# Patient Record
Sex: Male | Born: 1975
Health system: Southern US, Community
[De-identification: ages and names within clinical notes are randomized; demographics above are authoritative.]

## PROBLEM LIST (undated history)

## (undated) DIAGNOSIS — M1990 Unspecified osteoarthritis, unspecified site: Secondary | ICD-10-CM

## (undated) DIAGNOSIS — K219 Gastro-esophageal reflux disease without esophagitis: Secondary | ICD-10-CM

## (undated) DIAGNOSIS — T7840XA Allergy, unspecified, initial encounter: Secondary | ICD-10-CM

## (undated) HISTORY — DX: Unspecified osteoarthritis, unspecified site: M19.90

## (undated) HISTORY — PX: DRUG INDUCED ENDOSCOPY: SHX6808

## (undated) HISTORY — DX: Gastro-esophageal reflux disease without esophagitis: K21.9

## (undated) HISTORY — DX: Allergy, unspecified, initial encounter: T78.40XA

---

## 2008-08-27 ENCOUNTER — Emergency Department (HOSPITAL_COMMUNITY): Admission: EM | Admit: 2008-08-27 | Discharge: 2008-08-27 | Payer: Self-pay | Admitting: Emergency Medicine

## 2011-02-21 ENCOUNTER — Encounter: Payer: Self-pay | Admitting: *Deleted

## 2011-02-21 ENCOUNTER — Emergency Department
Admission: EM | Admit: 2011-02-21 | Discharge: 2011-02-21 | Disposition: A | Discharge status: Home / Self Care | Payer: 59 | Source: Home / Self Care | Attending: Emergency Medicine | Admitting: Emergency Medicine

## 2011-02-21 DIAGNOSIS — J209 Acute bronchitis, unspecified: Secondary | ICD-10-CM

## 2011-02-21 DIAGNOSIS — R059 Cough, unspecified: Secondary | ICD-10-CM

## 2011-02-21 DIAGNOSIS — R05 Cough: Secondary | ICD-10-CM

## 2011-02-21 DIAGNOSIS — J069 Acute upper respiratory infection, unspecified: Secondary | ICD-10-CM

## 2011-02-21 LAB — POCT RAPID STREP A (OFFICE): Rapid Strep A Screen: NEGATIVE

## 2011-02-21 MED ORDER — PROMETHAZINE-CODEINE 6.25-10 MG/5ML PO SYRP: 5.0000 mL | ORAL_SOLUTION | ORAL | Status: AC | PRN

## 2011-02-21 MED ORDER — AZITHROMYCIN 250 MG PO TABS: ORAL_TABLET | ORAL | Status: AC

## 2011-02-21 NOTE — ED Notes (Signed)
Pt c/o fever, dry cough, SOB, and mouth sores x 5 days. He has taken IBF and cough gtts.

## 2011-02-21 NOTE — ED Provider Notes (Signed)
History     CSN: [id number] Arrival date & time: 02/21/2011  5:59 PM   First MD Initiated Contact with Patient 02/21/11 1744      Chief Complaint  Patient presents with  . Fever  . Cough    (Consider location/radiation/quality/duration/timing/severity/associated sxs/prior treatment) HPI Noah Lopez is a 35 y.o. male who complains of onset of cold symptoms for 5 days. His wife is also present with him during his exam. His wife is a Publishing rights manager.  + sore throat + cough No pleuritic pain No wheezing No nasal congestion + post-nasal drainage No sinus pain/pressure + chest congestion No itchy/red eyes No earache No hemoptysis + SOB No chills/sweats No fever No nausea No vomiting No abdominal pain No diarrhea No skin rashes +o fatigue + myalgias + headache    History reviewed. No pertinent past medical history.  History reviewed. No pertinent past surgical history.  Family History  Problem Relation Age of Onset  . Diverticulitis Mother   . GER disease Father     History  Substance Use Topics  . Smoking status: Never Smoker   . Smokeless tobacco: Not on file  . Alcohol Use: No      Review of Systems  Allergies  Penicillins  Home Medications   Current Outpatient Rx  Name Route Sig Dispense Refill  . AZITHROMYCIN 250 MG PO TABS  Use as directed 1 each 0  . PROMETHAZINE-CODEINE 6.25-10 MG/5ML PO SYRP Oral Take 5 mLs by mouth every 4 (four) hours as needed for cough. 120 mL 0    BP 108/66  Pulse 58  Temp(Src) 99.5 F (37.5 C) (Oral)  Resp 18  Ht 6' (1.829 m)  Wt 186 lb 12.8 oz (84.732 kg)  BMI 25.33 kg/m2  SpO2 99%  Physical Exam  Nursing note and vitals reviewed. Constitutional: He is oriented to person, place, and time. He appears well-developed and well-nourished. He appears distressed (Coughing during exam).  HENT:  Head: Normocephalic and atraumatic.  Right Ear: Tympanic membrane, external ear and ear canal normal.  Left Ear:  Tympanic membrane, external ear and ear canal normal.  Nose: Mucosal edema and rhinorrhea present.  Mouth/Throat: Posterior oropharyngeal erythema present. No oropharyngeal exudate or posterior oropharyngeal edema.  Neck: Neck supple.  Cardiovascular: Regular rhythm and normal heart sounds.   Pulmonary/Chest: Effort normal and breath sounds normal. No respiratory distress. He has no wheezes. He has no rhonchi.  Neurological: He is alert and oriented to person, place, and time.  Skin: Skin is warm and dry.  Psychiatric: He has a normal mood and affect. His speech is normal.    ED Course  Procedures (including critical care time)  Labs Reviewed - No data to display No results found.   1. Acute bronchitis   2. Cough   3. Acute upper respiratory infections of unspecified site       MDM  1)  Take the prescribed antibiotic as instructed.  I discussed getting a chest x-ray with them and they would like to hold off for now. I also gave him a prescription for cough medicine. He states that he has gotten sick with a hydrocodone tablet previously, but is willing to try the codeine cough medicine. 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 He is off of work for the next 2-3 days.  If he continues to be sick, we can consider doing a chest x-ray versus starting him on  prednisone. 2)  Use nasal saline solution (over the counter) at least 3 times a day. 3)  Use over the counter decongestants like Zyrtec-D every 12 hours as needed to help with congestion.  If you have hypertension, do not take medicines with sudafed.  4)  Can take tylenol every 6 hours or motrin every 8 hours for pain or fever. 5)  Follow up with your primary doctor if no improvement in 5-7 days, sooner if increasing pain, fever, or new symptoms.     Lily Kocher, MD 02/21/11 Rickey Primus

## 2011-06-01 ENCOUNTER — Emergency Department (HOSPITAL_COMMUNITY)
Admission: EM | Admit: 2011-06-01 | Discharge: 2011-06-01 | Disposition: A | Discharge status: Home / Self Care | Payer: Worker's Compensation | Attending: Emergency Medicine | Admitting: Emergency Medicine

## 2011-06-01 ENCOUNTER — Encounter (HOSPITAL_COMMUNITY): Payer: Self-pay

## 2011-06-01 DIAGNOSIS — S01512A Laceration without foreign body of oral cavity, initial encounter: Secondary | ICD-10-CM

## 2011-06-01 DIAGNOSIS — S01501A Unspecified open wound of lip, initial encounter: Secondary | ICD-10-CM | POA: Insufficient documentation

## 2011-06-01 DIAGNOSIS — S025XXA Fracture of tooth (traumatic), initial encounter for closed fracture: Secondary | ICD-10-CM

## 2011-06-01 DIAGNOSIS — Y9269 Other specified industrial and construction area as the place of occurrence of the external cause: Secondary | ICD-10-CM | POA: Insufficient documentation

## 2011-06-01 DIAGNOSIS — Z23 Encounter for immunization: Secondary | ICD-10-CM | POA: Insufficient documentation

## 2011-06-01 DIAGNOSIS — Z880 Allergy status to penicillin: Secondary | ICD-10-CM | POA: Insufficient documentation

## 2011-06-01 MED ORDER — IBUPROFEN 600 MG PO TABS: 600.0000 mg | ORAL_TABLET | Freq: Four times a day (QID) | ORAL | Status: AC | PRN

## 2011-06-01 MED ORDER — TETANUS-DIPHTH-ACELL PERTUSSIS 5-2.5-18.5 LF-MCG/0.5 IM SUSP
0.5000 mL | Freq: Once | INTRAMUSCULAR | Status: AC
  Administered 2011-06-01: 0.5 mL via INTRAMUSCULAR
  Filled 2011-06-01: qty 0.5

## 2011-06-01 MED ORDER — IBUPROFEN 800 MG PO TABS
800.0000 mg | ORAL_TABLET | Freq: Once | ORAL | Status: AC
  Administered 2011-06-01: 800 mg via ORAL
  Filled 2011-06-01: qty 1

## 2011-06-01 MED ORDER — CHLORHEXIDINE GLUCONATE 0.12 % MT SOLN: 15.0000 mL | Freq: Two times a day (BID) | OROMUCOSAL | Status: AC
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
## 2011-06-01 NOTE — Discharge Instructions (Signed)
Mouth Laceration  A mouth laceration is a cut inside the mouth.  TREATMENT   Because of all the bacteria in the mouth, lacerations are usually not stitched (sutured) unless the wound is gaping open. Sometimes, a couple sutures may be placed just to hold the edges of the wound together and to speed healing. Over the next 1 to 2 days, you will see that the wound edges appear gray in color. The edges may appear ragged and slightly spread apart. Because of all the normal bacteria in the mouth, these wounds are contaminated, but this is not an infection that needs antibiotics. Most wounds heal with no problems despite their appearance.  HOME CARE INSTRUCTIONS    Rinse your mouth with a warm, saltwater wash 4 to 6 times per day, or as your caregiver instructs.   Continue oral hygiene and gentle tooth brushing as normal, if possible.   Do not eat or drink hot food or beverages while your mouth is still numb.   Eat a bland diet to avoid irritation from acidic foods.   Only take over-the-counter or prescription medicines for pain, discomfort, or fever as directed by your caregiver.   Follow up with your caregiver as instructed. You may need to see your caregiver for a wound check in 48 to 72 hours to make sure your wound is healing.   If your laceration was sutured, do not play with the sutures or knots with your tongue. If you do this, they will gradually loosen and may become untied.  You may need a tetanus shot if:   You cannot remember when you had your last tetanus shot.   You have never had a tetanus shot.  If you get a tetanus shot, your arm may swell, get red, and feel warm to the touch. This is common and not a problem. If you need a tetanus shot and you choose not to have one, there is a rare chance of getting tetanus. Sickness from tetanus can be serious.  SEEK MEDICAL CARE IF:    You develop swelling or increasing pain in the wound or in other parts of your face.   You have a fever.   You develop  swollen, tender glands in the throat.   You notice the wound edges do not stay together after your sutures have been removed.   You see pus coming from the wound. Some drainage in the mouth is normal.  MAKE SURE YOU:    Understand these instructions.   Will watch your condition.   Will get help right away if you are not doing well or get worse.  Document Released: 03/13/2005 Document Revised: 03/02/2011 Document Reviewed: 09/15/2010  [employer] Patient Information 2012 [employer].

## 2011-06-03 NOTE — ED Provider Notes (Signed)
36 y.o.malewho presents with minor mouth injury  HEENT: Two small lacerations over internal surface of the left upper lip with swelling noted and no active bleeding CV: RRR, no m/r/g, no pitting edema of the lower extremities Pulm:CTAB, no c/w/r GI: SNTND, + BS, no guarding or rebound Skin: no rashes noted MSK: moves all 4 extremities symmetrically, no deformities or injuries noted Neuro: CN 2-12 intact, no abnormalities of strength or sensation, A and O x 3  I saw and evaluated the patient, reviewed the resident's note and I agree with the findings and plan.        Cyndra Numbers, MD 06/03/11 1710

## 2012-08-09 ENCOUNTER — Ambulatory Visit: Payer: Self-pay

## 2012-08-09 ENCOUNTER — Other Ambulatory Visit: Payer: Self-pay | Admitting: Occupational Medicine

## 2012-08-09 DIAGNOSIS — R52 Pain, unspecified: Secondary | ICD-10-CM

## 2012-12-25 ENCOUNTER — Encounter (HOSPITAL_COMMUNITY): Payer: Self-pay | Admitting: Emergency Medicine

## 2012-12-25 ENCOUNTER — Emergency Department (HOSPITAL_COMMUNITY)
Admission: EM | Admit: 2012-12-25 | Discharge: 2012-12-25 | Disposition: A | Discharge status: Home / Self Care | Payer: 59 | Attending: Emergency Medicine | Admitting: Emergency Medicine

## 2012-12-25 ENCOUNTER — Emergency Department (HOSPITAL_COMMUNITY): Payer: 59

## 2012-12-25 DIAGNOSIS — Y9389 Activity, other specified: Secondary | ICD-10-CM | POA: Insufficient documentation

## 2012-12-25 DIAGNOSIS — R609 Edema, unspecified: Secondary | ICD-10-CM | POA: Insufficient documentation

## 2012-12-25 DIAGNOSIS — W219XXA Striking against or struck by unspecified sports equipment, initial encounter: Secondary | ICD-10-CM | POA: Insufficient documentation

## 2012-12-25 DIAGNOSIS — S92919A Unspecified fracture of unspecified toe(s), initial encounter for closed fracture: Secondary | ICD-10-CM | POA: Insufficient documentation

## 2012-12-25 DIAGNOSIS — Z791 Long term (current) use of non-steroidal anti-inflammatories (NSAID): Secondary | ICD-10-CM | POA: Insufficient documentation

## 2012-12-25 DIAGNOSIS — Z880 Allergy status to penicillin: Secondary | ICD-10-CM | POA: Insufficient documentation

## 2012-12-25 DIAGNOSIS — Y929 Unspecified place or not applicable: Secondary | ICD-10-CM | POA: Insufficient documentation

## 2012-12-25 DIAGNOSIS — S92401A Displaced unspecified fracture of right great toe, initial encounter for closed fracture: Secondary | ICD-10-CM

## 2012-12-25 MED ORDER — ONDANSETRON 8 MG PO TBDP: 8.0000 mg | ORAL_TABLET | Freq: Once | ORAL | Status: DC

## 2012-12-25 MED ORDER — HYDROCODONE-ACETAMINOPHEN 5-325 MG PO TABS: 1.0000 | ORAL_TABLET | ORAL | Status: DC | PRN

## 2012-12-25 MED ORDER — HYDROCODONE-ACETAMINOPHEN 5-325 MG PO TABS
1.0000 | ORAL_TABLET | Freq: Once | ORAL | Status: AC
  Administered 2012-12-25: 1 via ORAL
  Filled 2012-12-25: qty 1

## 2012-12-25 MED ORDER — ONDANSETRON 4 MG PO TBDP
8.0000 mg | ORAL_TABLET | Freq: Once | ORAL | Status: AC
Start: 2012-12-25 — End: 2012-12-25
  Administered 2012-12-25: 8 mg via ORAL
  Filled 2012-12-25: qty 2

## 2012-12-25 NOTE — ED Provider Notes (Signed)
CSN: [id number]     Arrival date & time 12/25/12  2122 History   First MD Initiated Contact with Patient 12/25/12 2201     Chief Complaint  Patient presents with  . Foot Injury   (Consider location/radiation/quality/duration/timing/severity/associated sxs/prior Treatment) HPI HPI Comments: Noah Lopez is a 37 y.o. male who presents to the Emergency Department complaining of constant sharp right great toe pain with radiation to the second toe that began after being hit with lacrosse ball around 8 PM this evening. Patient was wearing shoes at the time. Since incident patient has noticed swelling to the right great toe. He rates his pain 8/10 worsened with ambulating and palpation. He states resting minimally alleviates his pain. Denies fevers, LOC, HA.    History reviewed. No pertinent past medical history. History reviewed. No pertinent past surgical history. Family History  Problem Relation Age of Onset  . Diverticulitis Mother   . GER disease Father    History  Substance Use Topics  . Smoking status: Never Smoker   . Smokeless tobacco: Not on file  . Alcohol Use: No    Review of Systems  Constitutional: Negative for fever.  Respiratory: Negative for shortness of breath.   Cardiovascular: Negative for chest pain.  Musculoskeletal: Positive for myalgias, joint swelling and arthralgias.  Skin: Negative for wound.    Allergies  Penicillins  Home Medications   Current Outpatient Rx  Name  Route  Sig  Dispense  Refill  . ibuprofen (ADVIL,MOTRIN) 200 MG tablet   Oral   Take 600 mg by mouth every 6 (six) hours as needed for pain.         Marland Kitchen loratadine (CLARITIN) 10 MG tablet   Oral   Take 10 mg by mouth daily as needed for allergies.         . naproxen (NAPROSYN) 250 MG tablet   Oral   Take 250 mg by mouth 2 (two) times daily with a meal.         . HYDROcodone-acetaminophen (NORCO/VICODIN) 5-325 MG per tablet   Oral   Take 1-2 tablets by mouth every 4 (four)  hours as needed for pain.   30 tablet   0   . ondansetron (ZOFRAN-ODT) 8 MG disintegrating tablet   Oral   Take 1 tablet (8 mg total) by mouth once.   20 tablet   0    Triage Vitals: BP 110/79  Pulse 73  Temp(Src) 98.3 F (36.8 C) (Oral)  Resp 14  SpO2 98% Physical Exam  Constitutional: He is oriented to person, place, and time. He appears well-developed and well-nourished. No distress.  HENT:  Head: Normocephalic and atraumatic.  Right Ear: External ear normal.  Left Ear: External ear normal.  Nose: Nose normal.  Eyes: Conjunctivae are normal.  Neck: Neck supple.  Cardiovascular: Intact distal pulses.   Pulmonary/Chest: Effort normal.  Musculoskeletal:       Right ankle: Normal. Achilles tendon normal.       Left ankle: Normal.       Right foot: He exhibits tenderness, bony tenderness and swelling. He exhibits no crepitus, no deformity and no laceration.       Left foot: Normal.       Feet:  Decreased ROM of R great toe. Decreased strength of R great toe.   Neurological: He is alert and oriented to person, place, and time. No sensory deficit.  Skin: Skin is warm and dry. He is not diaphoretic.  Psychiatric: He has a normal  mood and affect.    ED Course  Procedures (including critical care time)   COORDINATION OF CARE:   Medications  HYDROcodone-acetaminophen (NORCO/VICODIN) 5-325 MG per tablet 1-2 tablet (not administered)  ondansetron (ZOFRAN-ODT) disintegrating tablet 8 mg (not administered)     Labs Review Labs Reviewed - No data to display Imaging Review Dg Foot Complete Right  12/25/2012   CLINICAL DATA:  Hit in great toe with lacrosse ball.  EXAM: RIGHT FOOT COMPLETE - 3+ VIEW  COMPARISON:  None.  FINDINGS: There is a comminuted fracture through the right great toe distal phalanx. Intra-articular extension. Minimal displacement of fracture fragments.  IMPRESSION: Comminuted intra-articular right great toe distal phalanx heel fracture.   Electronically  Signed   By: Charlett Nose M.D.   On: 12/25/2012 22:47    MDM   1. Fracture of right great toe    Afebrile, NAD, non-toxic appearing, AAOx4. Neurovascularly intact. Sensation intact. No open fracture. X-ray reviewed comminuted intra-articular right great toe distal phalanx fracture. Provided crutches, and walking boot with non-weight bearing advised. Pain medication advised. RICE method discussed. Patient will followup with Dr. Thurston Hole his orthopedist to schedule a followup appointment. Return precautions discussed. Patient is agreeable to plan. Patient is stable at time of discharge. Patient d/w with Dr. Manus Gunning, agrees with plan.      I personally performed the services described in this documentation, which was scribed in my presence. The recorded information has been reviewed and is accurate.     Lise Auer Sadiya Durand, PA-C 12/26/12 0015

## 2012-12-25 NOTE — ED Notes (Signed)
Pt. reports right foot pain /  injury while playing lacrosse this evening , pt. stated  right foot hit by lacrosse ball .

## 2012-12-26 NOTE — ED Provider Notes (Signed)
Medical screening examination/treatment/procedure(s) were performed by non-physician practitioner and as supervising physician I was immediately available for consultation/collaboration.   Wrenna Saks, MD 12/26/12 0022 

## 2014-03-12 ENCOUNTER — Emergency Department (HOSPITAL_COMMUNITY)
Admission: EM | Admit: 2014-03-12 | Discharge: 2014-03-12 | Disposition: A | Discharge status: Home / Self Care | Payer: Worker's Compensation | Attending: Emergency Medicine | Admitting: Emergency Medicine

## 2014-03-12 ENCOUNTER — Encounter (HOSPITAL_COMMUNITY): Payer: Self-pay

## 2014-03-12 ENCOUNTER — Emergency Department (HOSPITAL_COMMUNITY): Payer: Worker's Compensation

## 2014-03-12 DIAGNOSIS — Y998 Other external cause status: Secondary | ICD-10-CM | POA: Diagnosis not present

## 2014-03-12 DIAGNOSIS — T148XXA Other injury of unspecified body region, initial encounter: Secondary | ICD-10-CM

## 2014-03-12 DIAGNOSIS — Y9289 Other specified places as the place of occurrence of the external cause: Secondary | ICD-10-CM | POA: Diagnosis not present

## 2014-03-12 DIAGNOSIS — W108XXA Fall (on) (from) other stairs and steps, initial encounter: Secondary | ICD-10-CM | POA: Insufficient documentation

## 2014-03-12 DIAGNOSIS — Y9301 Activity, walking, marching and hiking: Secondary | ICD-10-CM | POA: Diagnosis not present

## 2014-03-12 DIAGNOSIS — S3992XA Unspecified injury of lower back, initial encounter: Secondary | ICD-10-CM | POA: Insufficient documentation

## 2014-03-12 DIAGNOSIS — S50312A Abrasion of left elbow, initial encounter: Secondary | ICD-10-CM | POA: Insufficient documentation

## 2014-03-12 DIAGNOSIS — S59902A Unspecified injury of left elbow, initial encounter: Secondary | ICD-10-CM | POA: Diagnosis present

## 2014-03-12 DIAGNOSIS — W19XXXA Unspecified fall, initial encounter: Secondary | ICD-10-CM

## 2014-03-12 DIAGNOSIS — M25522 Pain in left elbow: Secondary | ICD-10-CM

## 2014-03-12 MED ORDER — NAPROXEN 500 MG PO TABS: 500.0000 mg | ORAL_TABLET | Freq: Two times a day (BID) | ORAL | Status: DC

## 2014-03-12 NOTE — Discharge Instructions (Signed)
Elbow Contusion An elbow contusion is a deep bruise of the elbow. Contusions are the result of an injury that caused bleeding under the skin. The contusion may turn blue, purple, or yellow. Minor injuries will give you a painless contusion, but more severe contusions may stay painful and swollen for a few weeks.  CAUSES  An elbow contusion comes from a direct force to that area, such as falling on the elbow. SYMPTOMS   Swelling and redness of the elbow.  Bruising of the elbow area.  Tenderness or soreness of the elbow. DIAGNOSIS  You will have a physical exam and will be asked about your history. You may need an X-ray of your elbow to look for a broken bone (fracture).  TREATMENT  A sling or splint may be needed to support your injury. Resting, elevating, and applying cold compresses to the elbow area are often the best treatments for an elbow contusion. Over-the-counter medicines may also be recommended for pain control. HOME CARE INSTRUCTIONS   Put ice on the injured area.  Put ice in a plastic bag.  Place a towel between your skin and the bag.  Leave the ice on for 15-20 minutes, 03-04 times a day.  Only take over-the-counter or prescription medicines for pain, discomfort, or fever as directed by your caregiver.  Rest your injured elbow until the pain and swelling are better.  Elevate your elbow to reduce swelling.  Apply a compression wrap as directed by your caregiver. This can help reduce swelling and motion. You may remove the wrap for sleeping, showers, and baths. If your fingers become numb, cold, or blue, take the wrap off and reapply it more loosely.  Use your elbow only as directed by your caregiver. You may be asked to do range of motion exercises. Do them as directed.  See your caregiver as directed. It is very important to keep all follow-up appointments in order to avoid any long-term problems with your elbow, including chronic pain or inability to move your elbow  normally. SEEK IMMEDIATE MEDICAL CARE IF:   You have increased redness, swelling, or pain in your elbow.  Your swelling or pain is not relieved with medicines.  You have swelling of the hand and fingers.  You are unable to move your fingers or wrist.  You begin to lose feeling in your hand or fingers.  Your fingers or hand become cold or blue. MAKE SURE YOU:   Understand these instructions.  Will watch your condition.  Will get help right away if you are not doing well or get worse. Document Released: 02/19/2006 Document Revised: 06/05/2011 Document Reviewed: 01/27/2011 [employer] Patient Information 2015 [employer]. This information is not intended to replace advice given to you by your health care provider. Make sure you discuss any questions you have with your health care provider.  

## 2014-03-12 NOTE — ED Notes (Signed)
Per Patient, Patient was walking down a flight of concrete stairs when he slipped and landed at the bottom on his lower back. Patient complains of left elbow pain and lower back pain. Patient was able to ambulate after the fall. Swelling noted to the left hand. Patient reports 5/10 sharp, throbbing pain to the left elbow. Patient alert and oriented x4. Patient denies any head injury.

## 2014-03-12 NOTE — ED Provider Notes (Signed)
CSN: [id number]     Arrival date & time 03/12/14  1636 History  This chart was scribed for Noah Mail, PA-C working with Orpah Greek, * by Randa Evens, ED Scribe. This patient was seen in room TR07C/TR07C and the patient's care was started at 4:56 PM.    Chief Complaint  Patient presents with  . Fall  . Elbow Injury   The history is provided by the patient. No language interpreter was used.   HPI Comments: Brek Reece is a 38 y.o. male who presents to the Emergency Department complaining of fall onset PTA. Pt states that he was chasing some one and lost focus and slipped down about 10 ft of concrete steps and landed on the bottom step onto his back. He is complaining of throbbing elbow pain and low back pain. Pt states he had associated elbow swelling and bruising. Pt rates his elbow pain 5/10. He states that he has some numbness in his left fingers as well. Pt denies head injuy or LOC. Pt states that his TDAP is UTD   History reviewed. No pertinent past medical history. History reviewed. No pertinent past surgical history. Family History  Problem Relation Age of Onset  . Diverticulitis Mother   . GER disease Father    History  Substance Use Topics  . Smoking status: Never Smoker   . Smokeless tobacco: Not on file  . Alcohol Use: No    Review of Systems  Constitutional: Negative for fever and chills.  Musculoskeletal: Positive for back pain, joint swelling and arthralgias. Negative for gait problem.  Skin: Positive for color change.  Neurological: Positive for numbness. Negative for weakness.    Allergies  Penicillins  Home Medications   Prior to Admission medications   Medication Sig Start Date End Date Taking? Authorizing Provider  HYDROcodone-acetaminophen (NORCO/VICODIN) 5-325 MG per tablet Take 1-2 tablets by mouth every 4 (four) hours as needed for pain. 12/25/12   Jennifer L Piepenbrink, PA-C  ibuprofen (ADVIL,MOTRIN) 200 MG tablet Take 600 mg  by mouth every 6 (six) hours as needed for pain.    Historical Provider, MD  loratadine (CLARITIN) 10 MG tablet Take 10 mg by mouth daily as needed for allergies.    Historical Provider, MD  naproxen (NAPROSYN) 500 MG tablet Take 1 tablet (500 mg total) by mouth 2 (two) times daily with a meal. 03/12/14   Noah Mail, PA-C  ondansetron (ZOFRAN-ODT) 8 MG disintegrating tablet Take 1 tablet (8 mg total) by mouth once. 12/25/12   Jennifer L Piepenbrink, PA-C   Triage Vitals: BP 115/68 mmHg  Pulse 70  Temp(Src) 98.1 F (36.7 C) (Oral)  Resp 16  SpO2 99%   Physical Exam  Constitutional: He is oriented to person, place, and time. He appears well-developed and well-nourished. No distress.  HENT:  Head: Normocephalic and atraumatic.  Eyes: Conjunctivae and EOM are normal.  Neck: Neck supple. No tracheal deviation present.  Cardiovascular: Normal rate.   Pulmonary/Chest: Effort normal. No respiratory distress.  Musculoskeletal: Normal range of motion.  Tenderness to left lateral border of sacrum, no swelling, no step off, no bruising.  Left elbow: abrasion superior to olecranon bony tenderness on lateral epicondyle  of humerus, minimal extension pass 45 degrees but is able to supernate and pronate with no pain, distal pulses intact, grip strength intact, paresthesia in the ulnar nerve distrubution   Neurological: He is alert and oriented to person, place, and time.  Skin: Skin is warm and dry.  Psychiatric: He  has a normal mood and affect. His behavior is normal.  Nursing note and vitals reviewed.   ED Course  Procedures (including critical care time) DIAGNOSTIC STUDIES: Oxygen Saturation is 99% on RA, normal by my interpretation.    COORDINATION OF CARE: 5:05 PM-Discussed treatment plan with pt at bedside and pt agreed to plan.     Labs Review Labs Reviewed - No data to display  Imaging Review Dg Elbow Complete Left  03/12/2014   CLINICAL DATA:  [place] down stairs while running,  struck medial elbow on stair, abrasion, initial encounter  EXAM: LEFT ELBOW - COMPLETE 3+ VIEW  COMPARISON:  None  FINDINGS: Bone mineralization normal.  Joint spaces preserved.  No fracture, dislocation, or bone destruction.  No joint effusion.  IMPRESSION: Normal exam.   Electronically Signed   By: Lavonia Dana M.D.   On: 03/12/2014 18:58     EKG Interpretation None      MDM   Final diagnoses:  Abrasion  Elbow joint pain, left    Patient X-Ray negative for obvious fracture or dislocation. Pain managed in ED. Pt advised to follow up with orthopedics if symptoms persist for possibility of missed fracture diagnosis. Patient given brace while in ED, conservative therapy recommended and discussed. Patient will be dc home & is agreeable with above plan.   I personally performed the services described in this documentation, which was scribed in my presence. The recorded information has been reviewed and is accurate.        Noah Mail, PA-C 03/12/14 2336  Orpah Greek, MD 03/16/14 [phone]

## 2014-03-12 NOTE — ED Notes (Signed)
PA at the bedside.

## 2014-11-20 ENCOUNTER — Other Ambulatory Visit: Payer: Self-pay | Admitting: Nurse Practitioner

## 2014-11-20 ENCOUNTER — Ambulatory Visit
Admission: RE | Admit: 2014-11-20 | Discharge: 2014-11-20 | Disposition: A | Discharge status: Home / Self Care | Payer: Worker's Compensation | Source: Ambulatory Visit | Attending: Nurse Practitioner | Admitting: Nurse Practitioner

## 2014-11-20 DIAGNOSIS — M79671 Pain in right foot: Secondary | ICD-10-CM

## 2014-11-20 DIAGNOSIS — R609 Edema, unspecified: Secondary | ICD-10-CM

## 2016-01-17 ENCOUNTER — Ambulatory Visit (INDEPENDENT_AMBULATORY_CARE_PROVIDER_SITE_OTHER): Payer: Commercial Managed Care - HMO | Admitting: Internal Medicine

## 2016-01-17 ENCOUNTER — Encounter: Payer: Self-pay | Admitting: Internal Medicine

## 2016-01-17 VITALS — BP 108/76 | HR 45 | Temp 98.4°F | Ht 71.25 in | Wt 201.0 lb

## 2016-01-17 DIAGNOSIS — R630 Anorexia: Secondary | ICD-10-CM | POA: Diagnosis not present

## 2016-01-17 DIAGNOSIS — J301 Allergic rhinitis due to pollen: Secondary | ICD-10-CM

## 2016-01-17 DIAGNOSIS — R635 Abnormal weight gain: Secondary | ICD-10-CM | POA: Diagnosis not present

## 2016-01-17 DIAGNOSIS — R5383 Other fatigue: Secondary | ICD-10-CM | POA: Diagnosis not present

## 2016-01-17 DIAGNOSIS — J302 Other seasonal allergic rhinitis: Secondary | ICD-10-CM | POA: Insufficient documentation

## 2016-01-17 DIAGNOSIS — M1990 Unspecified osteoarthritis, unspecified site: Secondary | ICD-10-CM | POA: Insufficient documentation

## 2016-01-17 LAB — VITAMIN D 25 HYDROXY (VIT D DEFICIENCY, FRACTURES): VITD: 36.86 ng/mL (ref 30.00–100.00)

## 2016-01-17 LAB — COMPREHENSIVE METABOLIC PANEL
ALBUMIN: 4.7 g/dL (ref 3.5–5.2)
ALK PHOS: 55 U/L (ref 39–117)
ALT: 20 U/L (ref 0–53)
AST: 17 U/L (ref 0–37)
BUN: 14 mg/dL (ref 6–23)
CALCIUM: 10 mg/dL (ref 8.4–10.5)
CHLORIDE: 105 meq/L (ref 96–112)
CO2: 29 mEq/L (ref 19–32)
CREATININE: 1.14 mg/dL (ref 0.40–1.50)
GFR: 76.13 mL/min (ref >60.00)
Glucose, Bld: 86 mg/dL (ref 70–99)
POTASSIUM: 4.2 meq/L (ref 3.5–5.1)
Sodium: 140 mEq/L (ref 135–145)
TOTAL PROTEIN: 7.2 g/dL (ref 6.0–8.3)
Total Bilirubin: 0.5 mg/dL (ref 0.2–1.2)

## 2016-01-17 LAB — VITAMIN B12: VITAMIN B 12: 269 pg/mL (ref 211–911)

## 2016-01-17 LAB — CBC
HEMATOCRIT: 41.9 % (ref 39.0–52.0)
Hemoglobin: 13.8 g/dL (ref 13.0–17.0)
MCHC: 32.9 g/dL (ref 30.0–36.0)
MCV: 81 fl (ref 78.0–100.0)
PLATELETS: 239 10*3/uL (ref 150.0–400.0)
RBC: 5.17 Mil/uL (ref 4.22–5.81)
RDW: 13.8 % (ref 11.5–15.5)
WBC: 6.8 10*3/uL (ref 4.0–10.5)

## 2016-01-17 LAB — TSH: TSH: 2.55 u[IU]/mL (ref 0.35–4.50)

## 2016-01-17 NOTE — Assessment & Plan Note (Signed)
Continue Claritin daily.

## 2016-01-17 NOTE — Progress Notes (Signed)
HPI  Pt presents to the clinic today to establish care and management of the conditions listed below.  Seasonal Allergy: Occurs all year long. He has taken Claritin daily with good relief.  Arthritis: Mainly in his hands due to 20 years of playing lacrosse. He takes Advil as needed.    He also c/o extreme fatigue, loss of appetite and 20 lb weight gain. He has really started noticed this over the last few months. He is sleeping about 6 hours at night. He does nap during the day on the weekends. His wife reports that he does snore but has not witnessed any apnea episode. His sister has OSA. He denies changes in diet or activity level. He does reports increase in work stress over the last year.  Past Medical History:  Diagnosis Date  . Allergy   . Arthritis    hands    Current Outpatient Prescriptions  Medication Sig Dispense Refill  . ibuprofen (ADVIL,MOTRIN) 200 MG tablet Take 600 mg by mouth every 6 (six) hours as needed for pain.    Marland Kitchen loratadine (CLARITIN) 10 MG tablet Take 10 mg by mouth daily as needed for allergies.     No current facility-administered medications for this visit.     Allergies  Allergen Reactions  . Penicillins Anaphylaxis    Family History  Problem Relation Age of Onset  . Diverticulitis Mother   . Arthritis Mother   . GER disease Father     Social History   Social History  . Marital status: Married    Spouse name: N/A  . Number of children: N/A  . Years of education: N/A   Occupational History  . Not on file.   Social History Main Topics  . Smoking status: Never Smoker  . Smokeless tobacco: Never Used  . Alcohol use Yes     Comment: rare  . Drug use: No  . Sexual activity: Not on file   Other Topics Concern  . Not on file   Social History Narrative  . No narrative on file    ROS:  Constitutional: Pt reports fatigue and weight gain. Denies fever, malaise, headache.  HEENT: Denies eye pain, eye redness, ear pain, ringing in the  ears, wax buildup, runny nose, nasal congestion, bloody nose, or sore throat. Respiratory: Denies difficulty breathing, shortness of breath, cough or sputum production.   Cardiovascular: Denies chest pain, chest tightness, palpitations or swelling in the hands or feet.  Gastrointestinal: Pt reports loss of appetite. Denies abdominal pain, bloating, constipation, diarrhea or blood in the stool.  GU: Denies frequency, urgency, pain with urination, blood in urine, odor or discharge. Musculoskeletal: Denies decrease in range of motion, difficulty with gait, muscle pain or joint pain and swelling.  Skin: Denies redness, rashes, lesions or ulcercations.  Neurological: Denies dizziness, difficulty with memory, difficulty with speech or problems with balance and coordination.  Psych: Denies anxiety, depression, SI/HI.  No other specific complaints in a complete review of systems (except as listed in HPI above).  PE:  BP 108/76   Pulse (!) 45   Temp 98.4 F (36.9 C) (Oral)   Ht 5' 11.25" (1.81 m)   Wt 201 lb (91.2 kg)   SpO2 98%   BMI 27.84 kg/m  Wt Readings from Last 3 Encounters:  01/17/16 201 lb (91.2 kg)  02/21/11 186 lb 12.8 oz (84.7 kg)    General: Appears her stated age, well developed, well nourished in NAD. Skin: Dry and intact. No rashes or  bruising noted. Neck: Neck supple, trachea midline. No masses, lumps or thyromegaly present.  Cardiovascular: Bradycardic with normal rhythm. S1,S2 noted.  No murmur, rubs or gallops noted. No JVD or BLE edema.  Pulmonary/Chest: Normal effort and positive vesicular breath sounds. No respiratory distress. No wheezes, rales or ronchi noted.  Abdomen: Soft and nontender. No distention or masses noted. Liver, spleen and kidneys non palpable. Neurological: Alert and oriented.  Psychiatric: Mood and affect normal. Behavior is normal. Judgment and thought content normal.     Assessment and Plan:  Fatigue, loss of appetite and weight  gain:  Will check CBC, CMET, TSH, Vit D and B12 today ? If it could be stress related If labs normal, will refer to pulmonology for sleep study  Will follow up after labs, make an appt for your annual exam Webb Silversmith, NP

## 2016-01-17 NOTE — Assessment & Plan Note (Signed)
Continue Ibuprofen as needed. 

## 2016-01-17 NOTE — Patient Instructions (Signed)
Fatigue  Fatigue is feeling tired all of the time, a lack of energy, or a lack of motivation. Occasional or mild fatigue is often a normal response to activity or life in general. However, long-lasting (chronic) or extreme fatigue may indicate an underlying medical condition.  HOME CARE INSTRUCTIONS   Watch your fatigue for any changes. The following actions may help to lessen any discomfort you are feeling:  · Talk to your health care provider about how much sleep you need each night. Try to get the required amount every night.  · Take medicines only as directed by your health care provider.  · Eat a healthy and nutritious diet. Ask your health care provider if you need help changing your diet.  · Drink enough fluid to keep your urine clear or pale yellow.  · Practice ways of relaxing, such as yoga, meditation, massage therapy, or acupuncture.  · Exercise regularly.    · Change situations that cause you stress. Try to keep your work and personal routine reasonable.  · Do not abuse illegal drugs.  · Limit alcohol intake to no more than 1 drink per day for nonpregnant women and 2 drinks per day for men. One drink equals 12 ounces of beer, 5 ounces of wine, or 1½ ounces of hard liquor.  · Take a multivitamin, if directed by your health care provider.  SEEK MEDICAL CARE IF:   · Your fatigue does not get better.  · You have a fever.    · You have unintentional weight loss or gain.  · You have headaches.    · You have difficulty:      Falling asleep.    Sleeping throughout the night.  · You feel angry, guilty, anxious, or sad.     · You are unable to have a bowel movement (constipation).    · You skin is dry.     · Your legs or another part of your body is swollen.    SEEK IMMEDIATE MEDICAL CARE IF:   · You feel confused.    · Your vision is blurry.  · You feel faint or pass out.    · You have a severe headache.    · You have severe abdominal, pelvic, or back pain.    · You have chest pain, shortness of breath, or an  irregular or fast heartbeat.    · You are unable to urinate or you urinate less than normal.    · You develop abnormal bleeding, such as bleeding from the rectum, vagina, nose, lungs, or nipples.  · You vomit blood.     · You have thoughts about harming yourself or committing suicide.    · You are worried that you might harm someone else.       This information is not intended to replace advice given to you by your health care provider. Make sure you discuss any questions you have with your health care provider.     Document Released: 01/08/2007 Document Revised: 04/03/2014 Document Reviewed: 07/15/2013  Elsevier Interactive Patient Education ©2016 [employer]

## 2017-07-23 DIAGNOSIS — H5711 Ocular pain, right eye: Secondary | ICD-10-CM | POA: Diagnosis not present

## 2017-07-23 DIAGNOSIS — T1501XA Foreign body in cornea, right eye, initial encounter: Secondary | ICD-10-CM | POA: Diagnosis not present

## 2017-07-24 DIAGNOSIS — T1501XA Foreign body in cornea, right eye, initial encounter: Secondary | ICD-10-CM | POA: Diagnosis not present

## 2017-07-24 DIAGNOSIS — H5711 Ocular pain, right eye: Secondary | ICD-10-CM | POA: Diagnosis not present

## 2017-07-24 MED FILL — OFLOXACIN 0.3% EYE DROPS: 0.3 | 7 days supply | Qty: 5 | Fill #0

## 2017-07-25 DIAGNOSIS — S0501XA Injury of conjunctiva and corneal abrasion without foreign body, right eye, initial encounter: Secondary | ICD-10-CM | POA: Diagnosis not present

## 2017-07-25 DIAGNOSIS — H5711 Ocular pain, right eye: Secondary | ICD-10-CM | POA: Diagnosis not present

## 2017-12-27 ENCOUNTER — Other Ambulatory Visit: Payer: Self-pay

## 2017-12-27 ENCOUNTER — Emergency Department (HOSPITAL_COMMUNITY): Payer: No Typology Code available for payment source

## 2017-12-27 ENCOUNTER — Emergency Department (HOSPITAL_COMMUNITY)
Admission: EM | Admit: 2017-12-27 | Discharge: 2017-12-27 | Disposition: A | Discharge status: Home / Self Care | Payer: No Typology Code available for payment source | Attending: Emergency Medicine | Admitting: Emergency Medicine

## 2017-12-27 ENCOUNTER — Encounter (HOSPITAL_COMMUNITY): Payer: Self-pay | Admitting: Emergency Medicine

## 2017-12-27 DIAGNOSIS — Y9389 Activity, other specified: Secondary | ICD-10-CM | POA: Insufficient documentation

## 2017-12-27 DIAGNOSIS — S20211A Contusion of right front wall of thorax, initial encounter: Secondary | ICD-10-CM | POA: Diagnosis not present

## 2017-12-27 DIAGNOSIS — R079 Chest pain, unspecified: Secondary | ICD-10-CM | POA: Diagnosis not present

## 2017-12-27 DIAGNOSIS — Y999 Unspecified external cause status: Secondary | ICD-10-CM | POA: Insufficient documentation

## 2017-12-27 DIAGNOSIS — S299XXA Unspecified injury of thorax, initial encounter: Secondary | ICD-10-CM | POA: Diagnosis not present

## 2017-12-27 DIAGNOSIS — R52 Pain, unspecified: Secondary | ICD-10-CM

## 2017-12-27 DIAGNOSIS — Y92410 Unspecified street and highway as the place of occurrence of the external cause: Secondary | ICD-10-CM | POA: Diagnosis not present

## 2017-12-27 DIAGNOSIS — S20301A Unspecified superficial injuries of right front wall of thorax, initial encounter: Secondary | ICD-10-CM | POA: Diagnosis not present

## 2017-12-27 MED ORDER — NAPROXEN 500 MG PO TABS: 500.0000 mg | ORAL_TABLET | Freq: Two times a day (BID) | ORAL | 0 refills | Status: DC

## 2017-12-27 MED ORDER — IBUPROFEN 800 MG PO TABS
800.0000 mg | ORAL_TABLET | Freq: Once | ORAL | Status: AC
  Administered 2017-12-27: 800 mg via ORAL
  Filled 2017-12-27: qty 1

## 2017-12-27 NOTE — ED Notes (Signed)
Patient verbalizes understanding of discharge instructions. Opportunity for questioning and answers were provided. Armband removed by staff, pt discharged from ED.  

## 2017-12-27 NOTE — Discharge Instructions (Addendum)
Evaluated today for right-sided rib pain.  Chest x-ray and x-ray of your ribs did not show any sign of fraction.  I think you probably have a contusion on your ribs.  Please take naproxen for your pain.  You may also use a heating pad.  I have given you an incentive spirometer, please use this every 4 hours while you are awake.  If you need to cough he may use a pillow over your chest to splint.  Please make sure to take deep breaths.  Return to the ED with any new or worsening symptoms

## 2017-12-27 NOTE — ED Notes (Signed)
Incentive spirometer given with demonstration

## 2017-12-27 NOTE — ED Notes (Signed)
Patient transported to X-ray 
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
## 2017-12-28 MED FILL — NAPROXEN 500 MG TABLET: 500 | 15 days supply | Qty: 30 | Fill #0

## 2018-02-18 DIAGNOSIS — M9902 Segmental and somatic dysfunction of thoracic region: Secondary | ICD-10-CM | POA: Diagnosis not present

## 2018-02-18 DIAGNOSIS — M9904 Segmental and somatic dysfunction of sacral region: Secondary | ICD-10-CM | POA: Diagnosis not present

## 2018-02-18 DIAGNOSIS — M9903 Segmental and somatic dysfunction of lumbar region: Secondary | ICD-10-CM | POA: Diagnosis not present

## 2018-02-20 DIAGNOSIS — M9903 Segmental and somatic dysfunction of lumbar region: Secondary | ICD-10-CM | POA: Diagnosis not present

## 2018-02-20 DIAGNOSIS — M9904 Segmental and somatic dysfunction of sacral region: Secondary | ICD-10-CM | POA: Diagnosis not present

## 2018-02-20 DIAGNOSIS — M9902 Segmental and somatic dysfunction of thoracic region: Secondary | ICD-10-CM | POA: Diagnosis not present

## 2018-02-25 DIAGNOSIS — M9902 Segmental and somatic dysfunction of thoracic region: Secondary | ICD-10-CM | POA: Diagnosis not present

## 2018-02-25 DIAGNOSIS — M9904 Segmental and somatic dysfunction of sacral region: Secondary | ICD-10-CM | POA: Diagnosis not present

## 2018-02-25 DIAGNOSIS — M9903 Segmental and somatic dysfunction of lumbar region: Secondary | ICD-10-CM | POA: Diagnosis not present

## 2018-03-06 MED FILL — AZITHROMYCIN 250 MG TABLET: 250 | 5 days supply | Qty: 6 | Fill #0

## 2018-03-11 DIAGNOSIS — M9902 Segmental and somatic dysfunction of thoracic region: Secondary | ICD-10-CM | POA: Diagnosis not present

## 2018-03-11 DIAGNOSIS — M9904 Segmental and somatic dysfunction of sacral region: Secondary | ICD-10-CM | POA: Diagnosis not present

## 2018-03-11 DIAGNOSIS — M9903 Segmental and somatic dysfunction of lumbar region: Secondary | ICD-10-CM | POA: Diagnosis not present

## 2019-09-22 ENCOUNTER — Encounter: Payer: Self-pay | Admitting: Gastroenterology

## 2019-11-20 ENCOUNTER — Ambulatory Visit: Payer: 59 | Admitting: Gastroenterology

## 2019-11-20 ENCOUNTER — Encounter: Payer: Self-pay | Admitting: Gastroenterology

## 2019-11-20 VITALS — BP 110/70 | HR 71 | Ht 72.0 in | Wt 205.0 lb

## 2019-11-20 DIAGNOSIS — R05 Cough: Secondary | ICD-10-CM | POA: Diagnosis not present

## 2019-11-20 DIAGNOSIS — K219 Gastro-esophageal reflux disease without esophagitis: Secondary | ICD-10-CM | POA: Diagnosis not present

## 2019-11-20 DIAGNOSIS — R059 Cough, unspecified: Secondary | ICD-10-CM

## 2019-11-20 MED ORDER — DEXLANSOPRAZOLE 60 MG PO CPDR: 60.0000 mg | DELAYED_RELEASE_CAPSULE | Freq: Every day | ORAL | 0 refills | Status: DC

## 2019-11-20 NOTE — Patient Instructions (Addendum)
If you are age 42 or older, your body mass index should be between 23-30. Your Body mass index is 27.8 kg/m. If this is out of the aforementioned range listed, please consider follow up with your Primary Care Provider.  If you are age 101 or younger, your body mass index should be between 19-25. Your Body mass index is 27.8 kg/m. If this is out of the aformentioned range listed, please consider follow up with your Primary Care Provider.   You have been scheduled for an endoscopy. Please follow written instructions given to you at your visit today. If you use inhalers (even only as needed), please bring them with you on the day of your procedure.  We have given you samples of the following medication to take: Dexilant 60 mg: Take once daily  Thank you for entrusting me with your care and for choosing [employer], Dr. Covington Cellar

## 2019-11-20 NOTE — Progress Notes (Signed)
HPI :  44 year old male with a history of GERD, chronic cough, referred by Golden Hurter, NP for GERD and chronic cough.  The patient is a Engineer, structural in [place].  He reports he has had reflux bothering him for the past 6 years.  By reflux he refers to episodic coughing he gets often after eating, as well as when lying down to sleep.  He does not have much typical pyrosis or regurgitation.  He does not have chest discomfort or epigastric pain.  He denies any dysphagia.  Occasionally will have vomiting if he coughs quite hard, although that is not common.  His weight has been stable.  He denies any change in his bowel habits.  There is no blood in his stools.  He has used an over-the-counter remedies such as Zantac and Prilosec in the past.  He states these have helped his symptoms to some extent however they tend to come back after few weeks and no longer work.  He has never had a higher dose of PPI other than Prilosec 20 mg a day.  He does feel that eating reliably reproduces his symptoms as well as when he lies down at night, he eats late, frequently prior to bedtime.  He does not have any shortness of breath or dyspnea.  He is able to function quite well at his job.  Does have seasonal allergies for which she takes Claritin.  His mother may have IBD, no family history of colon cancer or esophageal cancer.  He has never had a prior endoscopy.    Past Medical History:  Diagnosis Date  . Allergy   . Arthritis    hands  . GERD (gastroesophageal reflux disease)      History reviewed. No pertinent surgical history. Family History  Problem Relation Age of Onset  . Diverticulitis Mother   . Arthritis Mother   . GER disease Father    Social History   Tobacco Use  . Smoking status: Never Smoker  . Smokeless tobacco: Never Used  Substance Use Topics  . Alcohol use: Yes    Comment: rare  . Drug use: No   Current Outpatient Medications  Medication Sig Dispense Refill  . ibuprofen  (ADVIL,MOTRIN) 200 MG tablet Take 600 mg by mouth every 6 (six) hours as needed for pain.     Marland Kitchen loratadine (CLARITIN) 10 MG tablet Take 10 mg by mouth daily as needed for allergies.     . naproxen (NAPROSYN) 500 MG tablet Take 1 tablet (500 mg total) by mouth 2 (two) times daily. 30 tablet 0  . dexlansoprazole (DEXILANT) 60 MG capsule Take 1 capsule (60 mg total) by mouth daily. [id number]:[id number], Exp: 04-2021 15 capsule 0   No current facility-administered medications for this visit.   Allergies  Allergen Reactions  . Penicillins Anaphylaxis     Review of Systems: All systems reviewed and negative except where noted in HPI.   Lab Results  Component Value Date   WBC 6.8 01/17/2016   HGB 13.8 01/17/2016   HCT 41.9 01/17/2016   MCV 81.0 01/17/2016   PLT 239.0 01/17/2016      Physical Exam: BP 110/70 (BP Location: Right Arm, Patient Position: Sitting, Cuff Size: Normal)   Pulse 71   Ht 6' (1.829 m)   Wt 205 lb (93 kg)   BMI 27.80 kg/m  Constitutional: Pleasant,well-developed, male in no acute distress. HEENT: Normocephalic and atraumatic. Conjunctivae are normal. No scleral icterus. Neck supple.  Cardiovascular: Normal rate, regular  rhythm.  Pulmonary/chest: Effort normal and breath sounds normal.  Abdominal: Soft, nondistended, nontender. There are no masses palpable.  Extremities: no edema Lymphadenopathy: No cervical adenopathy noted. Neurological: Alert and oriented to person place and time. Skin: Skin is warm and dry. No rashes noted. Psychiatric: Normal mood and affect. Behavior is normal.   ASSESSMENT AND PLAN: 44 year old male here for new patient assessment of the following:  Chronic cough / GERD - we discussed GERD in general, symptoms that can be related to it, and how chronic cough can be one manifestation of GERD.  Interestingly he does not have more typical pyrosis or regurgitation that we would typically see with this.  That being said his symptoms appear  postprandial and often at nighttime with sleeping, and he has had some limited benefit with antacid therapy in the past although has not had a good trial of high-dose PPI to this point.  I relayed to him it certainly possible that chronic cough is related to reflux, although it also possible there could be other etiologies.  In order to help sort this out, I am recommending an upper endoscopy to further evaluate, assess for erosive esophagitis, large hiatal hernia, rule out EOE in light of his seasonal allergies.  After discussion of risks and benefits he was agreeable to this.  In the interim we had samples of Dexilant in the office, will give him a trial of Dexilant 60 mg a day for 2 weeks to see if this makes any significant difference in his symptoms.  If he states his symptoms resolved with high-dose PPI then this would argue reflux is causing his chronic cough.  Further recommendations pending results of endoscopy and his course.  He agreed.  If he does not get benefit with high-dose PPI, and EGD is normal, we will need to pursue other causes for his chronic cough, and would consider 24-hour pH test off PPI to assess burden of reflux.  De Witt Cellar, MD [facility]

## 2019-12-02 IMAGING — CR DG CHEST 2V
2 series · 2 of 2 positions shown · non-contrast
Comparison: None.

CLINICAL DATA: Pain following fall

EXAM:
CHEST - 2 VIEW

[chest pa]
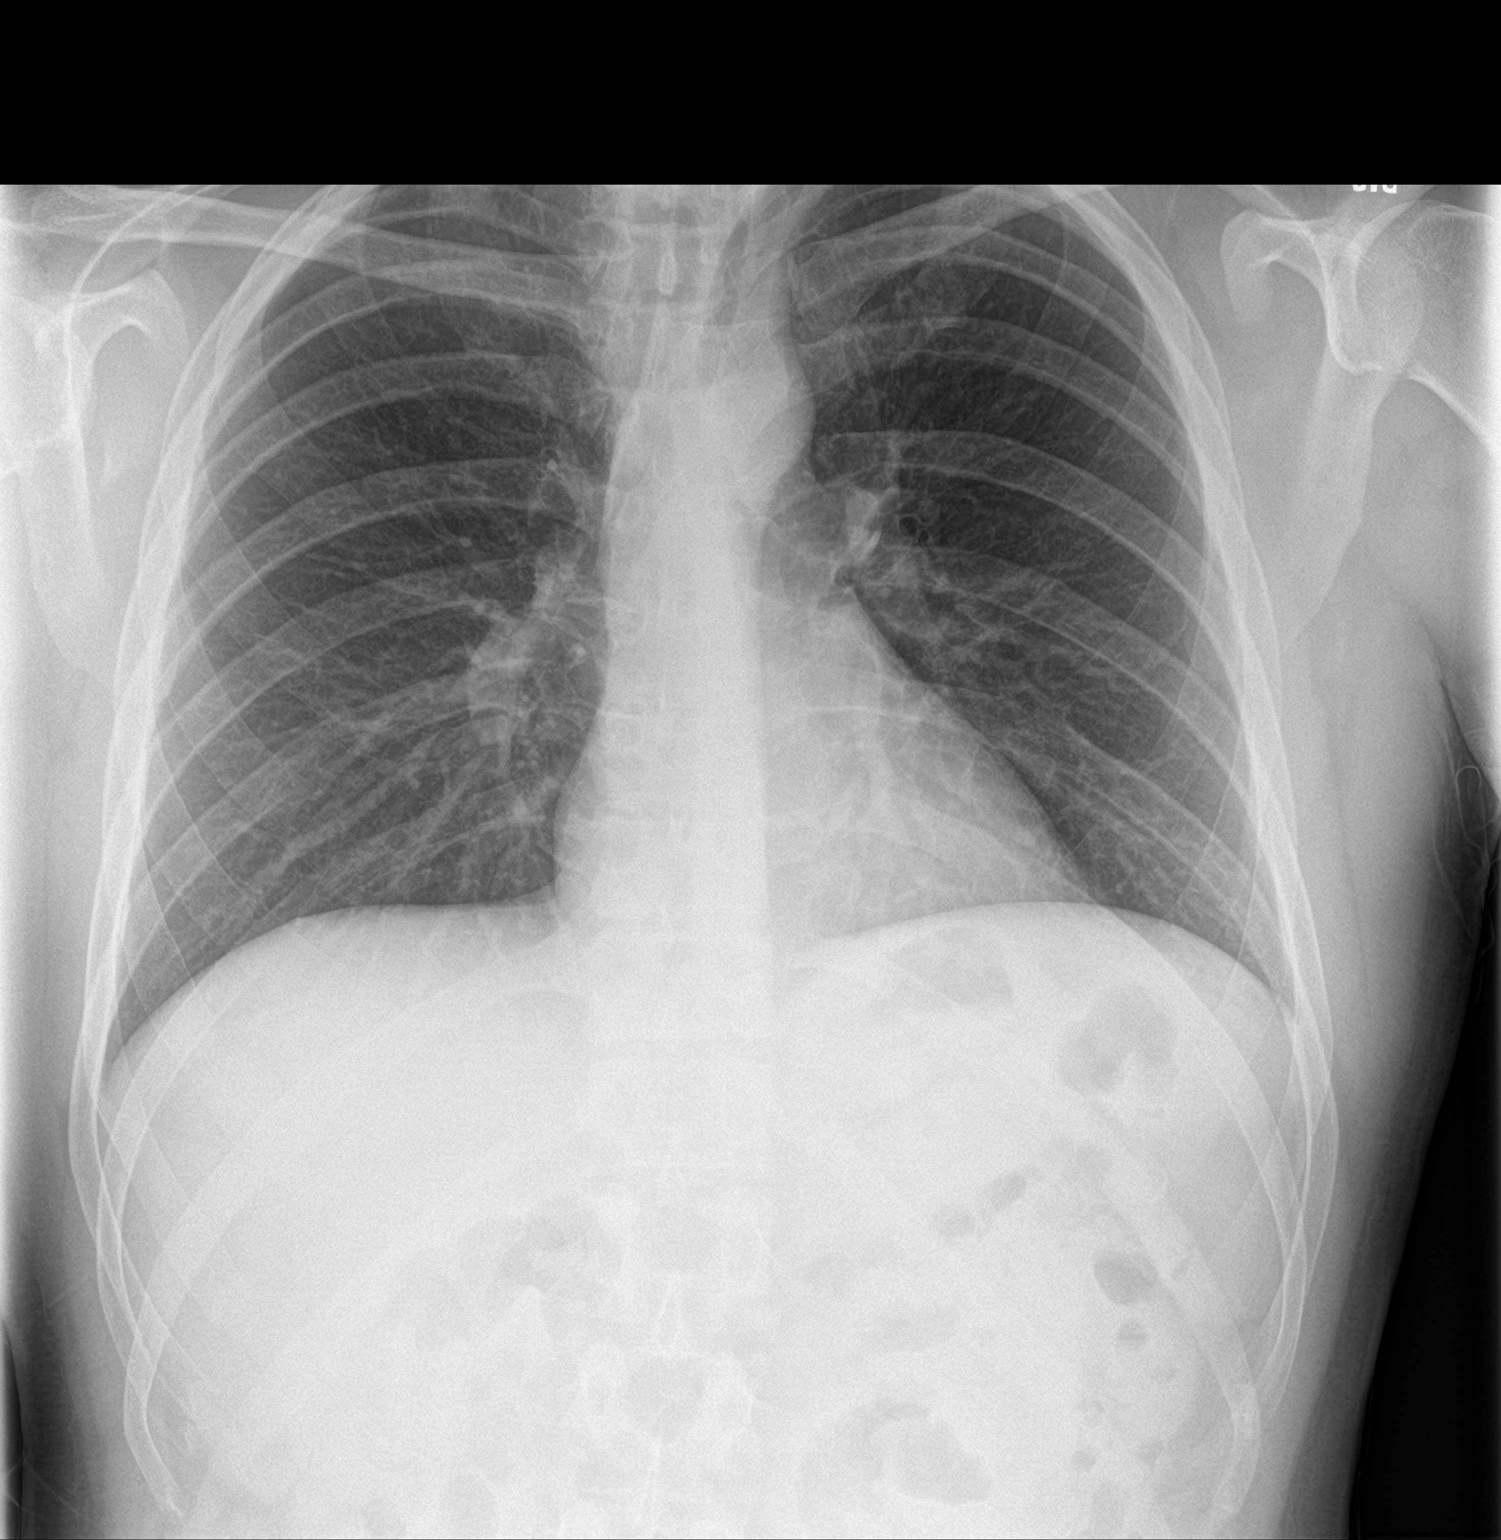

[chest lat]
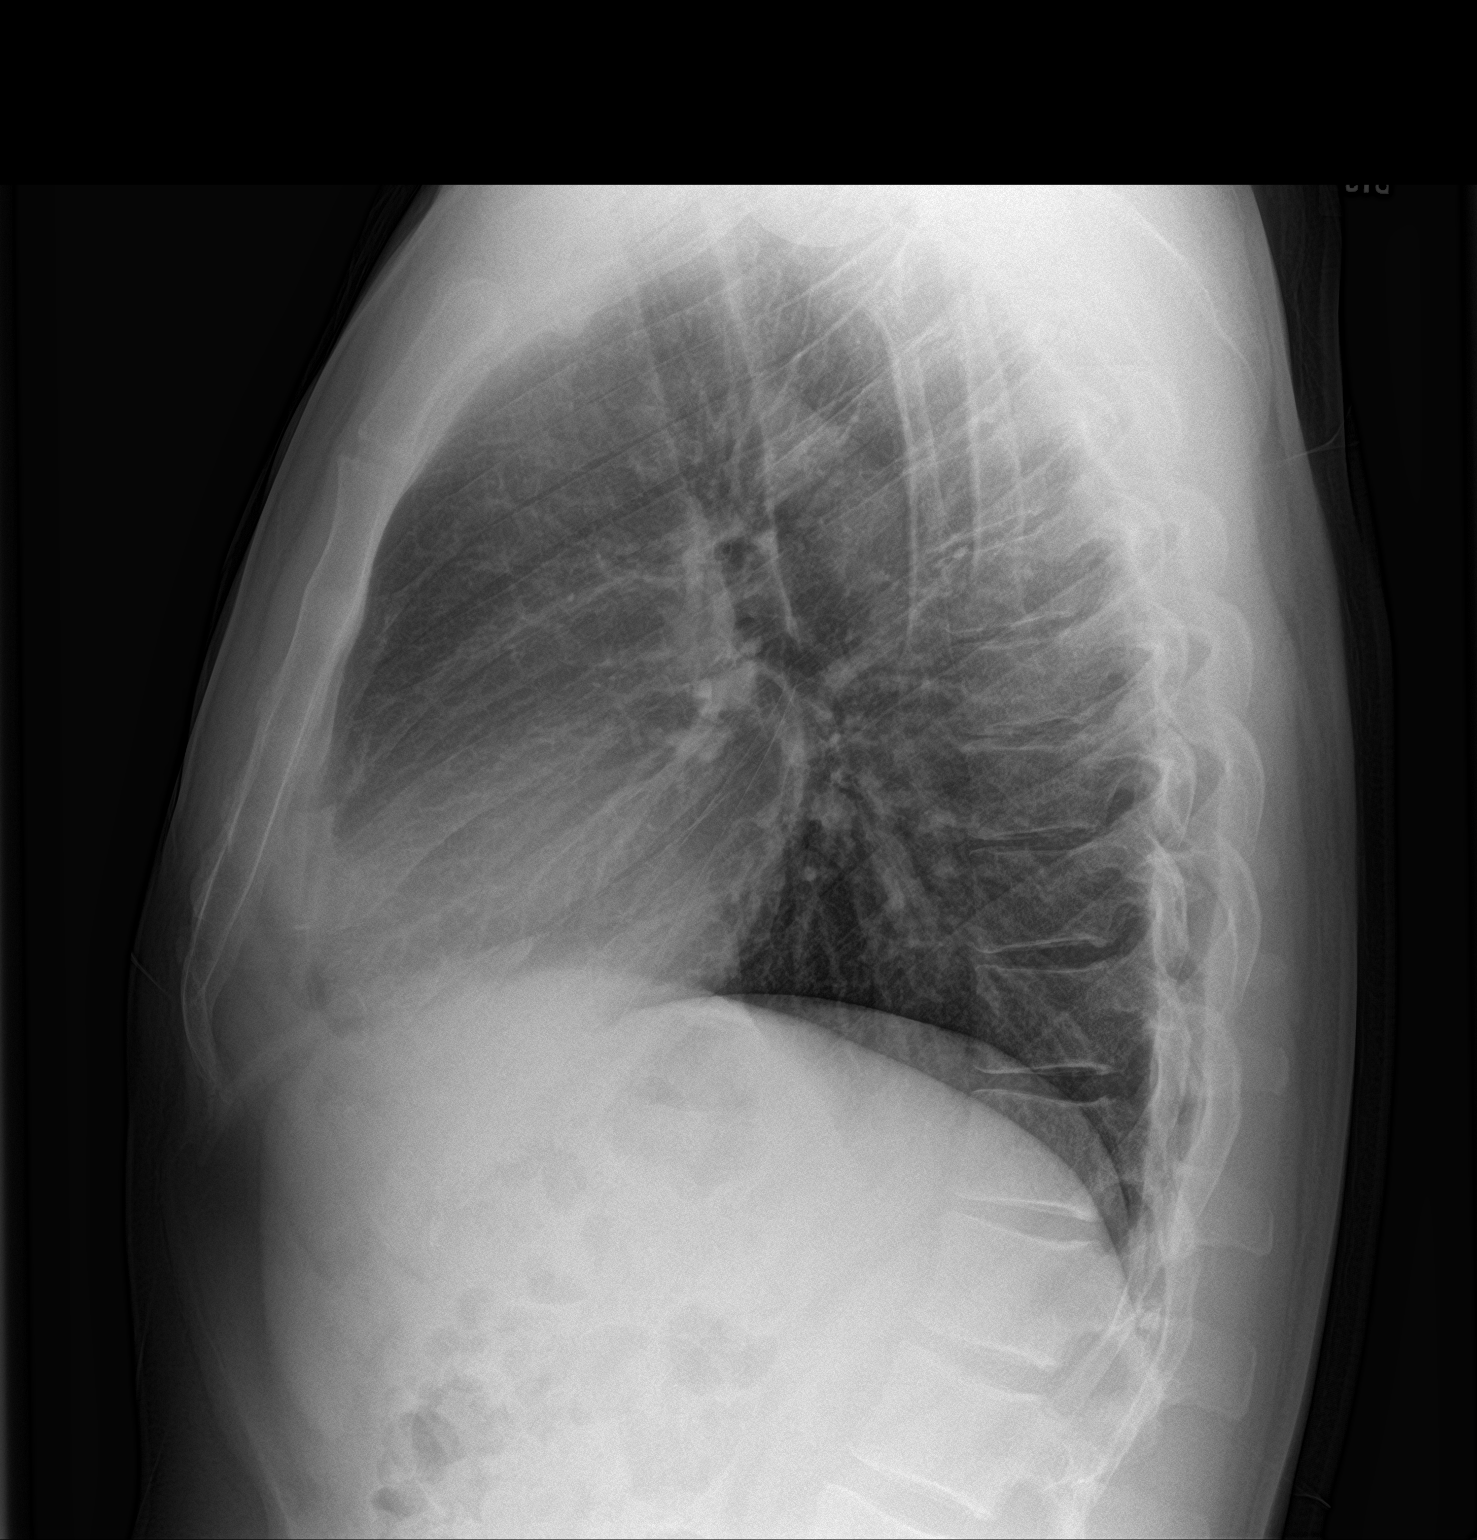

[2 of 2 positions shown; findings below may reference images not displayed]

FINDINGS: Lungs are clear. Heart size and pulmonary vascularity are normal. No
adenopathy. No pneumothorax. No bone lesions.
IMPRESSION: No edema or consolidation.

## 2019-12-09 ENCOUNTER — Encounter: Payer: Self-pay | Admitting: Gastroenterology

## 2019-12-10 ENCOUNTER — Telehealth: Payer: Self-pay | Admitting: Gastroenterology

## 2019-12-10 ENCOUNTER — Other Ambulatory Visit: Payer: Self-pay | Admitting: Gastroenterology

## 2019-12-10 MED ORDER — DEXLANSOPRAZOLE 60 MG PO CPDR: 60.0000 mg | DELAYED_RELEASE_CAPSULE | Freq: Every day | ORAL | 2 refills | Status: DC

## 2019-12-10 MED FILL — DEXILANT DR 60 MG CAPSULE: 60 | 30 days supply | Qty: 30 | Fill #0

## 2019-12-10 NOTE — Telephone Encounter (Signed)
Pt is requesting a refill on his [place]

## 2019-12-10 NOTE — Telephone Encounter (Signed)
Script sent to [facility].

## 2019-12-15 ENCOUNTER — Ambulatory Visit (INDEPENDENT_AMBULATORY_CARE_PROVIDER_SITE_OTHER): Payer: 59

## 2019-12-15 ENCOUNTER — Other Ambulatory Visit: Payer: Self-pay | Admitting: Gastroenterology

## 2019-12-15 DIAGNOSIS — Z1159 Encounter for screening for other viral diseases: Secondary | ICD-10-CM

## 2019-12-15 LAB — SARS CORONAVIRUS 2 (TAT 6-24 HRS): SARS Coronavirus 2: NEGATIVE

## 2019-12-17 ENCOUNTER — Other Ambulatory Visit: Payer: Self-pay

## 2019-12-17 ENCOUNTER — Encounter: Payer: Self-pay | Admitting: Gastroenterology

## 2019-12-17 ENCOUNTER — Ambulatory Visit (AMBULATORY_SURGERY_CENTER): Payer: 59 | Admitting: Gastroenterology

## 2019-12-17 VITALS — BP 98/58 | HR 58 | Temp 97.5°F | Resp 15 | Ht 72.0 in | Wt 205.0 lb

## 2019-12-17 DIAGNOSIS — K319 Disease of stomach and duodenum, unspecified: Secondary | ICD-10-CM | POA: Diagnosis not present

## 2019-12-17 DIAGNOSIS — K449 Diaphragmatic hernia without obstruction or gangrene: Secondary | ICD-10-CM

## 2019-12-17 DIAGNOSIS — K219 Gastro-esophageal reflux disease without esophagitis: Secondary | ICD-10-CM

## 2019-12-17 DIAGNOSIS — D130 Benign neoplasm of esophagus: Secondary | ICD-10-CM

## 2019-12-17 DIAGNOSIS — R059 Cough, unspecified: Secondary | ICD-10-CM

## 2019-12-17 MED ORDER — SODIUM CHLORIDE 0.9 % IV SOLN: 500.0000 mL | Freq: Once | INTRAVENOUS | Status: DC

## 2019-12-17 NOTE — Patient Instructions (Signed)
YOU HAD AN ENDOSCOPIC PROCEDURE TODAY AT [facility]:   Refer to the procedure report that was given to you for any specific questions about what was found during the examination.  If the procedure report does not answer your questions, please call your gastroenterologist to clarify.  If you requested that your care partner not be given the details of your procedure findings, then the procedure report has been included in a sealed envelope for you to review at your convenience later.  YOU SHOULD EXPECT: Some feelings of bloating in the abdomen. Passage of more gas than usual.  Walking can help get rid of the air that was put into your GI tract during the procedure and reduce the bloating. If you had a lower endoscopy (such as a colonoscopy or flexible sigmoidoscopy) you may notice spotting of blood in your stool or on the toilet paper. If you underwent a bowel prep for your procedure, you may not have a normal bowel movement for a few days.  **Handout given on Hiatal Hernia**  Please Note:  You might notice some irritation and congestion in your nose or some drainage.  This is from the oxygen used during your procedure.  There is no need for concern and it should clear up in a day or so.  SYMPTOMS TO REPORT IMMEDIATELY:    Following upper endoscopy (EGD)  Vomiting of blood or coffee ground material  New chest pain or pain under the shoulder blades  Painful or persistently difficult swallowing  New shortness of breath  Fever of 100F or higher  Black, tarry-looking stools  For urgent or emergent issues, a gastroenterologist can be reached at any hour by calling [phone]. Do not use MyChart messaging for urgent concerns.    DIET:  We do recommend a small meal at first, but then you may proceed to your regular diet.  Drink plenty of fluids but you should avoid alcoholic beverages for 24 hours.  ACTIVITY:  You should plan to take it easy for the rest of today and you  should NOT DRIVE or use heavy machinery until tomorrow (because of the sedation medicines used during the test).    FOLLOW UP: Our staff will call the number listed on your records 48-72 hours following your procedure to check on you and address any questions or concerns that you may have regarding the information given to you following your procedure. If we do not reach you, we will leave a message.  We will attempt to reach you two times.  During this call, we will ask if you have developed any symptoms of COVID 19. If you develop any symptoms (ie: fever, flu-like symptoms, shortness of breath, cough etc.) before then, please call [phone].  If you test positive for Covid 19 in the 2 weeks post procedure, please call and report this information to [place].    If any biopsies were taken you will be contacted by phone or by letter within the next 1-3 weeks.  Please call us at [phone] if you have not heard about the biopsies in 3 weeks.    SIGNATURES/CONFIDENTIALITY: You and/or your care partner have signed paperwork which will be entered into your electronic medical record.  These signatures attest to the fact that that the information above on your After Visit Summary has been reviewed and is understood.  Full responsibility of the confidentiality of this discharge information lies with you and/or your care-partner.

## 2019-12-17 NOTE — Progress Notes (Signed)
Sh vitals and SF Iv.

## 2019-12-17 NOTE — Op Note (Signed)
[facility] Patient Name: Noah Lopez Procedure Date: 12/17/2019 10:11 AM MRN: [id number] Endoscopist: Remo Lipps P. Havery Moros , MD Age: 44 Referring MD:  Date of Birth: 20-Dec-1975 Gender: Male Account #: [id number] Procedure:                Upper GI endoscopy Indications:              Suspected gastro-esophageal reflux disease, Chronic                            cough - some mild improvement with low dose                            omeprazole, trial of Dexilant 60mg  / day has                            provided significant improvement Medicines:                Monitored Anesthesia Care Procedure:                Pre-Anesthesia Assessment:                           - Prior to the procedure, a History and Physical                            was performed, and patient medications and                            allergies were reviewed. The patient's tolerance of                            previous anesthesia was also reviewed. The risks                            and benefits of the procedure and the sedation                            options and risks were discussed with the patient.                            All questions were answered, and informed consent                            was obtained. Prior Anticoagulants: The patient has                            taken no previous anticoagulant or antiplatelet                            agents. ASA Grade Assessment: II - A patient with                            mild systemic disease. After reviewing the risks  and benefits, the patient was deemed in                            satisfactory condition to undergo the procedure.                           After obtaining informed consent, the endoscope was                            passed under direct vision. Throughout the                            procedure, the patient's blood pressure, pulse, and                            oxygen saturations were  monitored continuously. The                            Endoscope was introduced through the mouth, and                            advanced to the second part of duodenum. The upper                            GI endoscopy was accomplished without difficulty.                            The patient tolerated the procedure well. Scope In: Scope Out: Findings:                 Esophagogastric landmarks were identified: the                            Z-line was found at 40 cm, the gastroesophageal                            junction was found at 40 cm and the upper extent of                            the gastric folds was found at 43 cm from the                            incisors.                           A 3-4 cm sliding hiatal hernia was present.                           A single benign appearing 3 mm polyp was found 20                            cm from the incisors. The polyp was removed with a  cold biopsy forceps. Resection and retrieval were                            complete.                           The exam of the esophagus was otherwise normal.                           Biopsies were taken with a cold forceps in the                            upper third of the esophagus, in the middle third                            of the esophagus and in the lower third of the                            esophagus for histology.                           The entire examined stomach was normal.                           The duodenal bulb and second portion of the                            duodenum were normal. Complications:            No immediate complications. Estimated blood loss:                            Minimal. Estimated Blood Loss:     Estimated blood loss was minimal. Impression:               - Esophagogastric landmarks identified.                           - 3-4 cm sliding hiatal hernia.                           - Small esophageal polyp was found.  Resected and                            retrieved.                           - Normal esophagus otherwise - biopsies taken to                            rule out eosinophilic esophagitis                           - Normal stomach.                           - Normal duodenal bulb and  second portion of the                            duodenum. Recommendation:           - Patient has a contact number available for                            emergencies. The signs and symptoms of potential                            delayed complications were discussed with the                            patient. Return to normal activities tomorrow.                            Written discharge instructions were provided to the                            patient.                           - Resume previous diet.                           - Continue present medications                           - Await pathology results.                           - Continue with trial of Dexilant for another 1-2                            months. Will discuss trial of dose reduction long                            term, use lowest dose of medication needed to                            control symptoms Deborahann Poteat P. Havery Moros, MD 12/17/2019 10:29:31 AM This report has been signed electronically.

## 2019-12-17 NOTE — Progress Notes (Signed)
Report to PACU, RN, vss, BBS= Clear.  

## 2019-12-17 NOTE — Progress Notes (Signed)
Called to room to assist during endoscopic procedure.  Patient ID and intended procedure confirmed with present staff. Received instructions for my participation in the procedure from the performing physician.  

## 2019-12-19 ENCOUNTER — Telehealth: Payer: Self-pay

## 2019-12-19 NOTE — Telephone Encounter (Signed)
2nd follow up call made.  NALM 

## 2019-12-19 NOTE — Telephone Encounter (Signed)
Left message on answering machine. 

## 2019-12-24 ENCOUNTER — Encounter: Payer: Self-pay | Admitting: Gastroenterology

## 2020-03-29 MED FILL — DEXILANT DR 60 MG CAPSULE: 60 | 30 days supply | Qty: 30 | Fill #1

## 2020-08-03 ENCOUNTER — Emergency Department (HOSPITAL_COMMUNITY)
Admission: EM | Admit: 2020-08-03 | Discharge: 2020-08-03 | Disposition: A | Discharge status: Home / Self Care | Payer: No Typology Code available for payment source | Attending: Emergency Medicine | Admitting: Emergency Medicine

## 2020-08-03 ENCOUNTER — Emergency Department (HOSPITAL_COMMUNITY): Payer: No Typology Code available for payment source

## 2020-08-03 ENCOUNTER — Emergency Department (HOSPITAL_COMMUNITY): Payer: 59 | Attending: Physician Assistant

## 2020-08-03 ENCOUNTER — Other Ambulatory Visit: Payer: Self-pay

## 2020-08-03 DIAGNOSIS — Y990 Civilian activity done for income or pay: Secondary | ICD-10-CM | POA: Diagnosis not present

## 2020-08-03 DIAGNOSIS — S8991XA Unspecified injury of right lower leg, initial encounter: Secondary | ICD-10-CM | POA: Diagnosis present

## 2020-08-03 DIAGNOSIS — Y92410 Unspecified street and highway as the place of occurrence of the external cause: Secondary | ICD-10-CM | POA: Insufficient documentation

## 2020-08-03 DIAGNOSIS — S8702XA Crushing injury of left knee, initial encounter: Secondary | ICD-10-CM

## 2020-08-03 DIAGNOSIS — S8701XA Crushing injury of right knee, initial encounter: Secondary | ICD-10-CM | POA: Diagnosis not present

## 2020-08-03 NOTE — Discharge Instructions (Signed)
I recommend a combination of tylenol and ibuprofen for management of your pain. You can take a low dose of both at the same time. I recommend 500 mg of Tylenol combined with 600 mg of ibuprofen. This is one maximum strength Tylenol and three regular ibuprofen. You can take these 2-3 times for day for your pain. Please try to take these medications with a small amount of food as well to prevent upsetting your stomach.  Also, please consider topical pain relieving creams such as Voltaran Gel, BioFreeze, or Icy Hot. There is also a pain relieving cream made by [employer]. You should be able to find all of these at your local pharmacy.   Please continue to ice the left knee and elevate it when you are sitting.  This will help with both pain as well as swelling.  I have given you a new referral to [facility] since you have been evaluated by them in the past.  Their contact information is below.  Please give them a call and schedule a follow-up appointment at their office later this week.  If you develop any new or worsening symptoms, please return to the emergency department.  It was a pleasure to meet you.

## 2020-08-03 NOTE — Progress Notes (Signed)
Orthopedic Tech Progress Note Patient Details:  Noah Lopez 1975/11/06 [id number]  Ortho Devices Type of Ortho Device: Knee Immobilizer,Crutches Ortho Device/Splint Interventions: Ordered,Application,Adjustment   Post Interventions Patient Tolerated: Well,Ambulated well Instructions Provided: Care of device,Poper ambulation with device   Janit Pagan 08/03/2020, 6:11 PM
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
## 2020-08-03 NOTE — ED Notes (Signed)
Pt transported to Xray at this time.
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
## 2020-08-10 ENCOUNTER — Other Ambulatory Visit (HOSPITAL_COMMUNITY): Payer: Self-pay

## 2020-08-10 MED ORDER — CELECOXIB 200 MG PO CAPS
200.0000 mg | ORAL_CAPSULE | Freq: Two times a day (BID) | ORAL | 2 refills | Status: DC
  Filled 2020-08-10: qty 60, 30d supply, fill #0

## 2020-09-01 ENCOUNTER — Encounter (HOSPITAL_BASED_OUTPATIENT_CLINIC_OR_DEPARTMENT_OTHER): Payer: Self-pay | Admitting: Orthopaedic Surgery

## 2020-09-01 ENCOUNTER — Other Ambulatory Visit: Payer: Self-pay
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
## 2020-09-08 ENCOUNTER — Encounter (HOSPITAL_BASED_OUTPATIENT_CLINIC_OR_DEPARTMENT_OTHER): Payer: Self-pay | Admitting: Orthopaedic Surgery

## 2020-09-08 ENCOUNTER — Encounter (HOSPITAL_BASED_OUTPATIENT_CLINIC_OR_DEPARTMENT_OTHER)
Admission: RE | Disposition: A | Discharge status: Home / Self Care | Payer: Self-pay | Source: Home / Self Care | Attending: Orthopaedic Surgery

## 2020-09-08 ENCOUNTER — Ambulatory Visit (HOSPITAL_BASED_OUTPATIENT_CLINIC_OR_DEPARTMENT_OTHER): Payer: No Typology Code available for payment source | Admitting: Anesthesiology

## 2020-09-08 ENCOUNTER — Other Ambulatory Visit (HOSPITAL_COMMUNITY): Payer: Self-pay

## 2020-09-08 ENCOUNTER — Ambulatory Visit (HOSPITAL_BASED_OUTPATIENT_CLINIC_OR_DEPARTMENT_OTHER)
Admission: RE | Admit: 2020-09-08 | Discharge: 2020-09-08 | Disposition: A | Discharge status: Home / Self Care | Payer: No Typology Code available for payment source | Attending: Orthopaedic Surgery | Admitting: Orthopaedic Surgery

## 2020-09-08 ENCOUNTER — Other Ambulatory Visit: Payer: Self-pay

## 2020-09-08 DIAGNOSIS — S83232A Complex tear of medial meniscus, current injury, left knee, initial encounter: Secondary | ICD-10-CM | POA: Insufficient documentation

## 2020-09-08 DIAGNOSIS — S83242A Other tear of medial meniscus, current injury, left knee, initial encounter: Secondary | ICD-10-CM | POA: Diagnosis present

## 2020-09-08 DIAGNOSIS — Z880 Allergy status to penicillin: Secondary | ICD-10-CM | POA: Insufficient documentation

## 2020-09-08 DIAGNOSIS — Z791 Long term (current) use of non-steroidal anti-inflammatories (NSAID): Secondary | ICD-10-CM | POA: Diagnosis not present

## 2020-09-08 DIAGNOSIS — Y9389 Activity, other specified: Secondary | ICD-10-CM | POA: Insufficient documentation

## 2020-09-08 DIAGNOSIS — Z8261 Family history of arthritis: Secondary | ICD-10-CM | POA: Diagnosis not present

## 2020-09-08 HISTORY — PX: KNEE ARTHROSCOPY WITH MEDIAL MENISECTOMY: SHX5651

## 2020-09-08 SURGERY — ARTHROSCOPY, KNEE, WITH MEDIAL MENISCECTOMY
Anesthesia: General | Site: Knee | Laterality: Left

## 2020-09-08 MED ORDER — OXYCODONE HCL 5 MG PO TABS
ORAL_TABLET | ORAL | 0 refills | Status: AC
  Filled 2020-09-08: qty 12, 3d supply, fill #0

## 2020-09-08 MED ORDER — SODIUM CHLORIDE 0.9 % IR SOLN
Status: DC | PRN
  Administered 2020-09-08: 1500 mL

## 2020-09-08 MED ORDER — ONDANSETRON HCL 4 MG/2ML IJ SOLN
INTRAMUSCULAR | Status: DC | PRN
  Administered 2020-09-08: 4 mg via INTRAVENOUS

## 2020-09-08 MED ORDER — MIDAZOLAM HCL 5 MG/5ML IJ SOLN
INTRAMUSCULAR | Status: DC | PRN
  Administered 2020-09-08: 2 mg via INTRAVENOUS

## 2020-09-08 MED ORDER — ASPIRIN 81 MG PO CHEW
81.0000 mg | CHEWABLE_TABLET | Freq: Two times a day (BID) | ORAL | 0 refills | Status: AC
  Filled 2020-09-08: qty 60, 30d supply, fill #0

## 2020-09-08 MED ORDER — ONDANSETRON HCL 4 MG/2ML IJ SOLN
INTRAMUSCULAR | Status: AC
  Filled 2020-09-08: qty 2

## 2020-09-08 MED ORDER — LACTATED RINGERS IV SOLN: INTRAVENOUS | Status: DC

## 2020-09-08 MED ORDER — FENTANYL CITRATE (PF) 100 MCG/2ML IJ SOLN
INTRAMUSCULAR | Status: AC
  Filled 2020-09-08: qty 2

## 2020-09-08 MED ORDER — CLINDAMYCIN PHOSPHATE 900 MG/50ML IV SOLN
900.0000 mg | INTRAVENOUS | Status: AC
  Administered 2020-09-08: 900 mg via INTRAVENOUS

## 2020-09-08 MED ORDER — FENTANYL CITRATE (PF) 100 MCG/2ML IJ SOLN
INTRAMUSCULAR | Status: DC | PRN
  Administered 2020-09-08: 50 ug via INTRAVENOUS
  Administered 2020-09-08: 25 ug via INTRAVENOUS

## 2020-09-08 MED ORDER — MIDAZOLAM HCL 2 MG/2ML IJ SOLN
INTRAMUSCULAR | Status: AC
  Filled 2020-09-08: qty 2

## 2020-09-08 MED ORDER — ACETAMINOPHEN 500 MG PO TABS
1000.0000 mg | ORAL_TABLET | Freq: Three times a day (TID) | ORAL | 0 refills | Status: AC
  Filled 2020-09-08: qty 84, 14d supply, fill #0

## 2020-09-08 MED ORDER — BUPIVACAINE HCL (PF) 0.25 % IJ SOLN
INTRAMUSCULAR | Status: AC
  Filled 2020-09-08: qty 30

## 2020-09-08 MED ORDER — CELECOXIB 200 MG PO CAPS
200.0000 mg | ORAL_CAPSULE | Freq: Two times a day (BID) | ORAL | 0 refills | Status: AC
  Filled 2020-09-08: qty 60, 30d supply, fill #0

## 2020-09-08 MED ORDER — PROPOFOL 10 MG/ML IV BOLUS
INTRAVENOUS | Status: AC
  Filled 2020-09-08: qty 20

## 2020-09-08 MED ORDER — CLINDAMYCIN PHOSPHATE 900 MG/50ML IV SOLN
INTRAVENOUS | Status: AC
  Filled 2020-09-08: qty 50

## 2020-09-08 MED ORDER — BUPIVACAINE HCL (PF) 0.25 % IJ SOLN
INTRAMUSCULAR | Status: DC | PRN
  Administered 2020-09-08: 20 mL via INTRA_ARTICULAR

## 2020-09-08 MED ORDER — DEXAMETHASONE SODIUM PHOSPHATE 10 MG/ML IJ SOLN
INTRAMUSCULAR | Status: DC | PRN
  Administered 2020-09-08: 5 mg via INTRAVENOUS

## 2020-09-08 MED ORDER — PROPOFOL 10 MG/ML IV BOLUS
INTRAVENOUS | Status: DC | PRN
  Administered 2020-09-08: 200 mg via INTRAVENOUS

## 2020-09-08 MED ORDER — ONDANSETRON HCL 4 MG PO TABS
4.0000 mg | ORAL_TABLET | Freq: Three times a day (TID) | ORAL | 0 refills | Status: AC | PRN
  Filled 2020-09-08: qty 10, 4d supply, fill #0

## 2020-09-08 MED ORDER — LIDOCAINE HCL (CARDIAC) PF 100 MG/5ML IV SOSY
PREFILLED_SYRINGE | INTRAVENOUS | Status: DC | PRN
  Administered 2020-09-08: 60 mg via INTRATRACHEAL

## 2020-09-08 SURGICAL SUPPLY — 40 items
APL PRP STRL LF DISP 70% ISPRP (MISCELLANEOUS) ×1
BANDAGE ESMARK 6X9 LF (GAUZE/BANDAGES/DRESSINGS) IMPLANT
BLADE CLIPPER SURG (BLADE) IMPLANT
BNDG CMPR 9X6 STRL LF SNTH (GAUZE/BANDAGES/DRESSINGS)
BNDG ELASTIC 6X5.8 VLCR STR LF (GAUZE/BANDAGES/DRESSINGS) ×2 IMPLANT
BNDG ESMARK 6X9 LF (GAUZE/BANDAGES/DRESSINGS)
CHLORAPREP W/TINT 26 (MISCELLANEOUS) ×2 IMPLANT
CLSR STERI-STRIP ANTIMIC 1/2X4 (GAUZE/BANDAGES/DRESSINGS) ×2 IMPLANT
CUFF TOURN SGL QUICK 34 (TOURNIQUET CUFF)
CUFF TRNQT CYL 34X4.125X (TOURNIQUET CUFF) IMPLANT
DISSECTOR 3.5MM X 13CM CVD (MISCELLANEOUS) IMPLANT
DISSECTOR 4.0MMX13CM CVD (MISCELLANEOUS) ×2 IMPLANT
DRAPE ARTHROSCOPY W/POUCH 90 (DRAPES) ×2 IMPLANT
DRAPE IMP U-DRAPE 54X76 (DRAPES) ×2 IMPLANT
DRAPE U-SHAPE 47X51 STRL (DRAPES) ×2 IMPLANT
GAUZE SPONGE 4X4 12PLY STRL (GAUZE/BANDAGES/DRESSINGS) ×2 IMPLANT
GLOVE SRG 8 PF TXTR STRL LF DI (GLOVE) ×1 IMPLANT
GLOVE SURG ENC MOIS LTX SZ6.5 (GLOVE) ×4 IMPLANT
GLOVE SURG LTX SZ8 (GLOVE) ×4 IMPLANT
GLOVE SURG UNDER POLY LF SZ6.5 (GLOVE) ×2 IMPLANT
GLOVE SURG UNDER POLY LF SZ7 (GLOVE) ×4 IMPLANT
GLOVE SURG UNDER POLY LF SZ8 (GLOVE) ×2
GOWN STRL REUS W/ TWL LRG LVL3 (GOWN DISPOSABLE) ×1 IMPLANT
GOWN STRL REUS W/TWL LRG LVL3 (GOWN DISPOSABLE) ×2
GOWN STRL REUS W/TWL XL LVL3 (GOWN DISPOSABLE) ×2 IMPLANT
IV NS IRRIG 3000ML ARTHROMATIC (IV SOLUTION) ×2 IMPLANT
KIT TURNOVER KIT B (KITS) ×2 IMPLANT
MANIFOLD NEPTUNE II (INSTRUMENTS) IMPLANT
NDL SAFETY ECLIPSE 18X1.5 (NEEDLE) IMPLANT
NEEDLE HYPO 18GX1.5 SHARP (NEEDLE)
NS IRRIG 1000ML POUR BTL (IV SOLUTION) IMPLANT
PACK ARTHROSCOPY DSU (CUSTOM PROCEDURE TRAY) ×2 IMPLANT
PORT APPOLLO RF 90DEGREE MULTI (SURGICAL WAND) IMPLANT
SLEEVE SCD COMPRESS KNEE MED (STOCKING) ×2 IMPLANT
SUT MNCRL AB 4-0 PS2 18 (SUTURE) ×2 IMPLANT
SYR 5ML LUER SLIP (SYRINGE) IMPLANT
TOWEL GREEN STERILE FF (TOWEL DISPOSABLE) ×2 IMPLANT
TUBE CONNECTING 20X1/4 (TUBING) ×2 IMPLANT
TUBING ARTHROSCOPY IRRIG 16FT (MISCELLANEOUS) ×2 IMPLANT
WATER STERILE IRR 1000ML POUR (IV SOLUTION) IMPLANT

## 2020-09-08 NOTE — Discharge Instructions (Addendum)
Ophelia Charter MD, MPH Noemi Chapel, PA-C [facility] [address] [phone] (tel)   [phone] (fax)   POST-OPERATIVE INSTRUCTIONS - Knee Arthroscopy  WOUND CARE - You may remove the Operative Dressing on Post-Op Day #3 (72hrs after surgery).   -  Alternatively if you would like you can leave dressing on until follow-up if within 7-8 days but keep it dry. - Leave steri-strips in place until they fall off on their own, usually 2 weeks postop. - An ACE wrap may be used to control swelling, do not wrap this too tight.  If the initial ACE wrap feels too tight you may loosen it. - There may be a small amount of fluid/bleeding leaking at the surgical site.  - This is normal; the knee is filled with fluid during the procedure and can leak for 24-48hrs after surgery. You may change/reinforce the bandage as needed.  - Use the Cryocuff or Ice as often as possible for the first 7 days, then as needed for pain relief. Always keep a towel, ACE wrap or other barrier between the cooling unit and your skin.  - You may shower on Post-Op Day #3. Gently pat the area dry. Do not soak the knee in water or submerge it.  - Do not go swimming in the pool or ocean until 4 weeks after surgery or when otherwise instructed.  Keep incisions as dry as possible.   BRACE/AMBULATION -  You will not need a brace after this procedure - You can put full weight on your operative leg as you feel comfortable - You can use crutches or a walker initially if needed, but this is not required   REGIONAL ANESTHESIA (NERVE BLOCKS) - The anesthesia team may have performed a nerve block for you if safe in the setting of your care.  This is a great tool used to minimize pain.  Typically the block may start wearing off overnight.  This can be a challenging period but please utilize your as needed pain medications to try and manage this period and know it will be a brief transition as the nerve block  wears completely   POST-OP MEDICATIONS - Multimodal approach to pain control - In general your pain will be controlled with a combination of substances.  Prescriptions unless otherwise discussed are electronically sent to your pharmacy.  This is a carefully made plan we use to minimize narcotic use.     - Celebrex - Anti-inflammatory medication taken on a scheduled basis - Acetaminophen - Non-narcotic pain medicine taken on a scheduled basis  - Oxycodone - This is a strong narcotic, to be used only on an "as needed" basis for severe pain. - Aspirin 81mg  - This medicine is used to minimize the risk of blood clots after surgery. -  Zofran - take as needed for nausea   FOLLOW-UP   Please call the office to schedule a follow-up appointment for your incision check, 7-10 days post-operatively.   IF YOU HAVE ANY QUESTIONS, PLEASE FEEL FREE TO CALL OUR OFFICE.   HELPFUL INFORMATION  - If you had a block, it will wear off between 8-24 hrs postop typically.  This is period when your pain may go from nearly zero to the pain you would have had post-op without the block.  This is an abrupt transition but nothing dangerous is happening.  You may take an extra dose of narcotic when this happens.   Keep your leg elevated to decrease swelling,  which will then in turn decrease your pain. I would elevate the foot of your bed by putting a couple of couch pillows between your mattress and box spring. I would not keep pillow directly under your ankle.  - Do not sleep with a pillow behind your knee even if it is more comfortable as this may make it harder to get your knee fully straight long term.   There will be MORE swelling on days 1-3 than there is on the day of surgery.  This also is normal. The swelling will decrease with the anti-inflammatory medication, ice and keeping it elevated. The swelling will make it more difficult to bend your knee. As the swelling goes down your motion will become  easier   You may develop swelling and bruising that extends from your knee down to your calf and perhaps even to your foot over the next week. Do not be alarmed. This too is normal, and it is due to gravity   There may be some numbness adjacent to the incision site. This may last for 6-12 months or longer in some patients and is expected.   You may return to sedentary work/school in the next couple of days when you feel up to it. You will need to keep your leg elevated as much as possible    You should wean off your narcotic medicines as soon as you are able.  Most patients will be off or using minimal narcotics before their first postop appointment.    We suggest you use the pain medication the first night prior to going to bed, in order to ease any pain when the anesthesia wears off. You should avoid taking pain medications on an empty stomach as it will make you nauseous.   Do not drink alcoholic beverages or take illicit drugs when taking pain medications.   It is against the law to drive while taking narcotics. You cannot drive if your Right leg is in brace locked in extension.   Pain medication may make you constipated.  Below are a few solutions to try in this order:  o Decrease the amount of pain medication if you aren't having pain.  o Drink lots of decaffeinated fluids.  o Drink prune juice and/or eat dried prunes   o If the first 3 don't work start with additional solutions  o Take Colace - an over-the-counter stool softener  o Take Senokot - an over-the-counter laxative  o Take Miralax - a stronger over-the-counter laxative    For more information including helpful videos and documents visit our website:   [email or web address]   Post Anesthesia Home Care Instructions  Activity: Get plenty of rest for the remainder of the day. A responsible individual must stay with you for 24 hours following the procedure.  For the next 24  hours, DO NOT: -Drive a car -Paediatric nurse -Drink alcoholic beverages -Take any medication unless instructed by your physician -Make any legal decisions or sign important papers.  Meals: Start with liquid foods such as gelatin or soup. Progress to regular foods as tolerated. Avoid greasy, spicy, heavy foods. If nausea and/or vomiting occur, drink only clear liquids until the nausea and/or vomiting subsides. Call your physician if vomiting continues.  Special Instructions/Symptoms: Your throat may feel dry or sore from the anesthesia or the breathing tube placed in your throat during surgery. If this causes discomfort, gargle with warm salt water. The discomfort should disappear within 24 hours.  If you had a scopolamine patch  placed behind your ear for the management of post- operative nausea and/or vomiting:  1. The medication in the patch is effective for 72 hours, after which it should be removed.  Wrap patch in a tissue and discard in the trash. Wash hands thoroughly with soap and water. 2. You may remove the patch earlier than 72 hours if you experience unpleasant side effects which may include dry mouth, dizziness or visual disturbances. 3. Avoid touching the patch. Wash your hands with soap and water after contact with the patch.

## 2020-09-08 NOTE — Transfer of Care (Signed)
Immediate Anesthesia Transfer of Care Note  Patient: Noah Lopez  Procedure(s) Performed: KNEE ARTHROSCOPY WITH MEDIAL MENISECTOMY (Left: Knee)  Patient Location: PACU  Anesthesia Type:General  Level of Consciousness: drowsy and patient cooperative  Airway & Oxygen Therapy: Patient Spontanous Breathing and Patient connected to face mask oxygen  Post-op Assessment: Report given to RN and Post -op Vital signs reviewed and stable  Post vital signs: Reviewed and stable  Last Vitals:  Vitals Value Taken Time  BP    Temp    Pulse 62 09/08/20 1136  Resp    SpO2 98 % 09/08/20 1136  Vitals shown include unvalidated device data.  Last Pain:  Vitals:   09/08/20 1025  TempSrc: Oral  PainSc: 0-No pain         Complications: No notable events documented.

## 2020-09-08 NOTE — Anesthesia Procedure Notes (Signed)
Procedure Name: LMA Insertion Date/Time: 09/08/2020 11:06 AM Performed by: Glory Buff, CRNA Pre-anesthesia Checklist: Patient identified, Emergency Drugs available, Suction available and Patient being monitored Patient Re-evaluated:Patient Re-evaluated prior to induction Oxygen Delivery Method: Circle system utilized Preoxygenation: Pre-oxygenation with 100% oxygen Induction Type: IV induction LMA: LMA inserted LMA Size: 4.0 Number of attempts: 1 Placement Confirmation: positive ETCO2 Tube secured with: Tape Dental Injury: Teeth and Oropharynx as per pre-operative assessment

## 2020-09-08 NOTE — Interval H&P Note (Signed)
History and Physical Interval Note:  09/08/2020 10:32 AM  Noah Lopez  has presented today for surgery, with the diagnosis of left knee medial meniscus tear.  The various methods of treatment have been discussed with the patient and family. After consideration of risks, benefits and other options for treatment, the patient has consented to  Procedure(s): KNEE ARTHROSCOPY WITH MEDIAL MENISECTOMY (Left) as a surgical intervention.  The patient's history has been reviewed, patient examined, no change in status, stable for surgery.  I have reviewed the patient's chart and labs.  Questions were answered to the patient's satisfaction.     Hiram Gash

## 2020-09-09 ENCOUNTER — Encounter (HOSPITAL_BASED_OUTPATIENT_CLINIC_OR_DEPARTMENT_OTHER): Payer: Self-pay | Admitting: Orthopaedic Surgery

## 2020-09-09 NOTE — Op Note (Signed)
Orthopaedic Surgery Operative Note (CSN: [id number])  Noah Lopez  11-24-1975 Date of Surgery: 09/08/2020   Diagnoses:  Left medial meniscus tear  Procedure: Left partial medial meniscectomy   Operative Finding Range of motion knee was normal, no ligamentous instability.  The knee was preserved in regards to cartilage but he had a complex posterior medial meniscus tear this degree to back to stable base.  There was a parrot-beak component as well.  About 30% total meniscal volume was resected.  Successful completion of the planned procedure.    Post-operative plan: The patient will be weightbearing to tolerance.  The patient will be discharged home.  DVT prophylaxis Aspirin 81 mg twice daily for 6 weeks.  Pain control with PRN pain medication preferring oral medicines.  Follow up plan will be scheduled in approximately 7 days for incision check.  Post-Op Diagnosis: Same Surgeons:Primary: Hiram Gash, MD Assistants:Caroline McBane PA-C Location: [facility] ROOM 6 Anesthesia: General with local Antibiotics: Clindamycin Tourniquet time: * Estimated Blood Loss: Minimal Complications: None Specimens: None Implants: * No implants in log *  Indications for Surgery:   Noah Lopez is a 46 y.o. male with medial meniscal tear.  He had continued mechanical symptoms this was a work-related injury.  Benefits and risks of operative and nonoperative management were discussed prior to surgery with patient/guardian(s) and informed consent form was completed.  Specific risks including infection, need for additional surgery, post meniscectomy syndrome, stiffness, continued pain.   Procedure:   The patient was identified properly. Informed consent was obtained and the surgical site was marked. The patient was taken up to suite where general anesthesia was induced. The patient was placed in the supine position with a post against the surgical leg and a nonsterile tourniquet applied. The surgical leg  was then prepped and draped usual sterile fashion.  A standard surgical timeout was performed.  2 standard anterior portals were made and diagnostic arthroscopy performed. Please note the findings as noted above.  We identified the medial meniscal tear and used a shaver and basket to debride back to a stable base.  We used the device to take about 30% of the total meniscal volume removed leaving behind a stable meniscus.  All loose fragments were cleared.  No sign of other loose bodies.  Incisions closed with absorbable suture. The patient was awoken from general anesthesia and taken to the PACU in stable condition without complication.   Noemi Chapel, PA-C, present and scrubbed throughout the case, critical for completion in a timely fashion, and for retraction, instrumentation, closure.

## 2020-09-12 NOTE — Anesthesia Preprocedure Evaluation (Signed)
Anesthesia Evaluation  Patient identified by MRN, date of birth, ID band Patient awake    Reviewed: Allergy & Precautions, NPO status , Patient's Chart, lab work & pertinent test results  History of Anesthesia Complications Negative for: history of anesthetic complications  Airway Mallampati: II  TM Distance: >3 FB Neck ROM: Full    Dental  (+) Dental Advisory Given   Pulmonary neg pulmonary ROS,    breath sounds clear to auscultation       Cardiovascular negative cardio ROS   Rhythm:Regular     Neuro/Psych negative neurological ROS  negative psych ROS   GI/Hepatic Neg liver ROS, GERD  ,  Endo/Other  negative endocrine ROS  Renal/GU negative Renal ROS     Musculoskeletal  (+) Arthritis ,   Abdominal   Peds  Hematology negative hematology ROS (+)   Anesthesia Other Findings   Reproductive/Obstetrics                             Anesthesia Physical Anesthesia Plan  ASA: 1  Anesthesia Plan: General   Post-op Pain Management:    Induction: Intravenous  PONV Risk Score and Plan: 2 and Ondansetron and Dexamethasone  Airway Management Planned: LMA  Additional Equipment: None  Intra-op Plan:   Post-operative Plan: Extubation in OR  Informed Consent: I have reviewed the patients History and Physical, chart, labs and discussed the procedure including the risks, benefits and alternatives for the proposed anesthesia with the patient or authorized representative who has indicated his/her understanding and acceptance.     Dental advisory given  Plan Discussed with: CRNA and Surgeon  Anesthesia Plan Comments:         Anesthesia Quick Evaluation

## 2020-09-12 NOTE — Anesthesia Postprocedure Evaluation (Signed)
Anesthesia Post Note  Patient: Noah Lopez  Procedure(s) Performed: KNEE ARTHROSCOPY WITH MEDIAL MENISECTOMY (Left: Knee)     Patient location during evaluation: PACU Anesthesia Type: General Level of consciousness: awake and alert Pain management: pain level controlled Vital Signs Assessment: post-procedure vital signs reviewed and stable Respiratory status: spontaneous breathing, nonlabored ventilation, respiratory function stable and patient connected to nasal cannula oxygen Cardiovascular status: blood pressure returned to baseline and stable Postop Assessment: no apparent nausea or vomiting Anesthetic complications: no   No notable events documented.  Last Vitals:  Vitals:   09/08/20 1158 09/08/20 1228  BP: 114/79 113/77  Pulse: 63 64  Resp: 15 14  Temp:  36.5 C  SpO2: 95% 96%    Last Pain:  Vitals:   09/08/20 1228  TempSrc:   PainSc: 0-No pain                 Kyelle Urbas

## 2020-09-17 ENCOUNTER — Other Ambulatory Visit (HOSPITAL_COMMUNITY): Payer: Self-pay

## 2020-09-17 ENCOUNTER — Other Ambulatory Visit: Payer: Self-pay | Admitting: Gastroenterology

## 2020-09-17 MED ORDER — DEXILANT 60 MG PO CPDR
1.0000 | DELAYED_RELEASE_CAPSULE | Freq: Every day | ORAL | 0 refills | Status: AC
  Filled 2020-09-17: qty 90, 90d supply, fill #0
  Filled 2020-09-24: qty 26, 26d supply, fill #0
  Filled 2020-09-24: qty 4, 4d supply, fill #0
  Filled 2020-10-04: qty 30, 30d supply, fill #1
  Filled 2021-06-01: qty 30, 30d supply, fill #2

## 2020-09-24 ENCOUNTER — Other Ambulatory Visit: Payer: Self-pay

## 2020-09-28 ENCOUNTER — Other Ambulatory Visit: Payer: Self-pay

## 2020-09-29 ENCOUNTER — Other Ambulatory Visit: Payer: Self-pay

## 2020-10-04 ENCOUNTER — Other Ambulatory Visit (HOSPITAL_COMMUNITY): Payer: Self-pay

## 2020-10-04 ENCOUNTER — Other Ambulatory Visit: Payer: Self-pay

## 2020-10-05 ENCOUNTER — Other Ambulatory Visit: Payer: Self-pay

## 2020-10-05 ENCOUNTER — Other Ambulatory Visit (HOSPITAL_COMMUNITY): Payer: Self-pay

## 2020-10-08 ENCOUNTER — Other Ambulatory Visit (HOSPITAL_COMMUNITY): Payer: Self-pay

## 2021-01-02 ENCOUNTER — Telehealth: Payer: 59 | Admitting: Family

## 2021-01-02 DIAGNOSIS — J209 Acute bronchitis, unspecified: Secondary | ICD-10-CM | POA: Diagnosis not present

## 2021-01-02 DIAGNOSIS — R051 Acute cough: Secondary | ICD-10-CM

## 2021-01-02 MED ORDER — PROMETHAZINE-DM 6.25-15 MG/5ML PO SYRP
5.0000 mL | ORAL_SOLUTION | Freq: Three times a day (TID) | ORAL | 0 refills | Status: DC | PRN
Start: 2021-01-02 — End: 2021-06-20

## 2021-01-02 MED ORDER — BENZONATATE 100 MG PO CAPS: 100.0000 mg | ORAL_CAPSULE | Freq: Three times a day (TID) | ORAL | 0 refills | Status: DC | PRN

## 2021-01-02 MED ORDER — PREDNISONE 10 MG (21) PO TBPK: ORAL_TABLET | ORAL | 0 refills | Status: DC

## 2021-01-02 NOTE — Patient Instructions (Signed)
Acute Bronchitis, Adult Acute bronchitis is sudden or acute swelling of the air tubes (bronchi) in the lungs. Acute bronchitis causes these tubes to fill with mucus, which can make it hard to breathe. It can also cause coughing or wheezing. In adults, acute bronchitis usually goes away within 2 weeks. A cough caused by bronchitis may last up to 3 weeks. Smoking, allergies, and asthma can make the condition worse. What are the causes? This condition can be caused by germs and by substances that irritate the lungs, including: Cold and flu viruses. The most common cause of this condition is the virus that causes the common cold. Bacteria. Substances that irritate the lungs, including: Smoke from cigarettes and other forms of tobacco. Dust and pollen. Fumes from chemical products, gases, or burned fuel. Other materials that pollute indoor or outdoor air. Close contact with someone who has acute bronchitis. What increases the risk? The following factors may make you more likely to develop this condition: A weak body's defense system, also called the immune system. A condition that affects your lungs and breathing, such as asthma. What are the signs or symptoms? Common symptoms of this condition include: Lung and breathing problems, such as: Coughing. This may bring up clear, yellow, or green mucus from your lungs (sputum). Wheezing. Having too much mucus in your lungs (chest congestion). Having shortness of breath. A fever. Chills. Aches and pains, including: Tightness in your chest and other body aches. A sore throat. How is this diagnosed? This condition is usually diagnosed based on: Your symptoms and medical history. A physical exam. You may also have other tests, including tests to rule out other conditions, such as pneumonia. These tests include: A test of lung function. Test of a mucus sample to look for the presence of bacteria. Tests to check the oxygen level in your  blood. Blood tests. Chest X-ray. How is this treated? Most cases of acute bronchitis clear up over time without treatment. Your health care provider may recommend: Drinking more fluids. This can thin your mucus, which may improve your breathing. Using a device that gets medicine into your lungs (inhaler) to help improve breathing and control coughing. Using a vaporizer or a humidifier. These are machines that add water to the air to help you breathe better. Taking a medicine for a fever. Taking a medicine that thins mucus and clears congestion (expectorant). Taking a medicine that prevents or stops coughing (cough suppressant). Follow these instructions at home: Activity Get plenty of rest. Return to your normal activities as told by your health care provider. Ask your health care provider what activities are safe for you. Lifestyle  Drink enough fluid to keep your urine pale yellow. Do not drink alcohol. Do not use any products that contain nicotine or tobacco, such as cigarettes, e-cigarettes, and chewing tobacco. If you need help quitting, ask your health care provider. Be aware that: Your bronchitis will get worse if you smoke or breathe in other people's smoke (secondhand smoke). Your lungs will heal faster if you quit smoking. General instructions Take over-the-counter and prescription medicines only as told by your health care provider. Use an inhaler, vaporizer, or humidifier as told by your health care provider. If you have a sore throat, gargle with a salt-water mixture 3-4 times a day or as needed. To make a salt-water mixture, completely dissolve -1 tsp (3-6 g) of salt in 1 cup (237 mL) of warm water. Take two teaspoons of honey at bedtime to lessen coughing at night.  Keep all follow-up visits as told by your health care provider. This is important. How is this prevented? To lower your risk of getting this condition again: Wash your hands often with soap and water. If soap  and water are not available, use hand sanitizer. Avoid contact with people who have cold symptoms. Try not to touch your mouth, nose, or eyes with your hands. Avoid places where there are fumes from chemicals. Breathing these fumes will make your condition worse. Get the flu shot every year. Contact a health care provider if: Your symptoms do not improve after 2 weeks of treatment. You vomit more than once or twice. You have symptoms of dehydration such as: Dark urine. Dry skin or eyes. Increased thirst. Headaches. Confusion. Muscle cramps. Get help right away if you: Cough up blood. Feel pain in your chest. Have severe shortness of breath. Faint or keep feeling like you are going to faint. Have a severe headache. Have fever or chills that get worse. These symptoms may represent a serious problem that is an emergency. Do not wait to see if the symptoms will go away. Get medical help right away. Call your local emergency services (911 in the [place] Do not drive yourself to the hospital. Summary Acute bronchitis is sudden (acute) inflammation of the air tubes (bronchi) between the windpipe and the lungs. In adults, acute bronchitis usually goes away within 2 weeks, although coughing may last 3 weeks or longer. Take over-the-counter and prescription medicines only as told by your health care provider. Drink enough fluid to keep your urine pale yellow. Contact a health care provider if your symptoms do not improve after 2 weeks of treatment. Get help right away if you cough up blood, faint, or have chest pain or shortness of breath. This information is not intended to replace advice given to you by your health care provider. Make sure you discuss any questions you have with your health care provider. Document Revised: 02/11/2020 Document Reviewed: 10/04/2018 Elsevier Patient Education  [place].

## 2021-01-02 NOTE — Progress Notes (Signed)
Virtual Visit Consent   Noah Lopez, you are scheduled for a virtual visit with a [facility] provider today.     Just as with appointments in the office, your consent must be obtained to participate.  Your consent will be active for this visit and any virtual visit you may have with one of our providers in the next 365 days.     If you have a MyChart account, a copy of this consent can be sent to you electronically.  All virtual visits are billed to your insurance company just like a traditional visit in the office.    As this is a virtual visit, video technology does not allow for your provider to perform a traditional examination.  This may limit your provider's ability to fully assess your condition.  If your provider identifies any concerns that need to be evaluated in person or the need to arrange testing (such as labs, EKG, etc.), we will make arrangements to do so.     Although advances in technology are sophisticated, we cannot ensure that it will always work on either your end or our end.  If the connection with a video visit is poor, the visit may have to be switched to a telephone visit.  With either a video or telephone visit, we are not always able to ensure that we have a secure connection.     I need to obtain your verbal consent now.   Are you willing to proceed with your visit today?    Noah Lopez has provided verbal consent on 01/02/2021 for a virtual visit (video or telephone).   Evelina Dun, FNP   Date: 01/02/2021 8:56 AM   Virtual Visit via Video Note   I, Evelina Dun, connected with  Noah Lopez  ([id number], 05/16/1975) on 01/02/21 at  9:00 AM EDT by a video-enabled telemedicine application and verified that I am speaking with the correct person using two identifiers.  Location: Patient: Virtual Visit Location Patient: Home Provider: Virtual Visit Location Provider: Home   I discussed the limitations of evaluation and management by telemedicine and the  availability of in person appointments. The patient expressed understanding and agreed to proceed.    History of Present Illness: Noah Lopez is a 45 y.o. who identifies as a male who was assigned male at birth, and is being seen today for cough that started Monday. He has tested for COVID and was negative.   HPI: Cough This is a new problem. The current episode started in the past 7 days. The problem has been gradually worsening. The problem occurs every few minutes. The cough is Productive of sputum. Associated symptoms include chills (resolved), headaches, nasal congestion, postnasal drip and a sore throat. Pertinent negatives include no ear congestion, ear pain, fever, myalgias, shortness of breath or wheezing. He has tried rest and OTC cough suppressant (tylenol) for the symptoms. The treatment provided mild relief.   Problems:  Patient Active Problem List   Diagnosis Date Noted   Seasonal allergies 01/17/2016   Arthritis 01/17/2016    Allergies:  Allergies  Allergen Reactions   Penicillins Anaphylaxis   Medications:  Current Outpatient Medications:    benzonatate (TESSALON PERLES) 100 MG capsule, Take 1 capsule (100 mg total) by mouth 3 (three) times daily as needed., Disp: 20 capsule, Rfl: 0   predniSONE (STERAPRED UNI-PAK 21 TAB) 10 MG (21) TBPK tablet, Use as directed, Disp: 21 tablet, Rfl: 0   promethazine-dextromethorphan (PROMETHAZINE-DM) 6.25-15 MG/5ML syrup, Take 5 mLs by  mouth 3 (three) times daily as needed for cough., Disp: 118 mL, Rfl: 0   DEXILANT 60 MG capsule, Take 1 capsule (60 mg total) by mouth daily., Disp: 90 capsule, Rfl: 0   loratadine (CLARITIN) 10 MG tablet, Take 10 mg by mouth daily as needed for allergies. , Disp: , Rfl:   Observations/Objective: Patient is well-developed, well-nourished in no acute distress.  Resting comfortably at home.  Head is normocephalic, atraumatic.  No labored breathing. Speech is clear and coherent with logical content.   Patient is alert and oriented at baseline.  Nasal congestion, coarse cough  Assessment and Plan: 1. Acute cough  2. Acute bronchitis, unspecified organism - predniSONE (STERAPRED UNI-PAK 21 TAB) 10 MG (21) TBPK tablet; Use as directed  Dispense: 21 tablet; Refill: 0 - benzonatate (TESSALON PERLES) 100 MG capsule; Take 1 capsule (100 mg total) by mouth 3 (three) times daily as needed.  Dispense: 20 capsule; Refill: 0 - promethazine-dextromethorphan (PROMETHAZINE-DM) 6.25-15 MG/5ML syrup; Take 5 mLs by mouth 3 (three) times daily as needed for cough.  Dispense: 118 mL; Refill: 0 - Take meds as prescribed - Use a cool mist humidifier  -Use saline nose sprays frequently -Force fluids -For any cough or congestion  Use plain Mucinex- regular strength or max strength is fine -For fever or aces or pains- take tylenol or ibuprofen. -Throat lozenges if help   Follow Up Instructions: I discussed the assessment and treatment plan with the patient. The patient was provided an opportunity to ask questions and all were answered. The patient agreed with the plan and demonstrated an understanding of the instructions.  A copy of instructions were sent to the patient via MyChart unless otherwise noted below.     The patient was advised to call back or seek an in-person evaluation if the symptoms worsen or if the condition fails to improve as anticipated.  Time:  I spent 16 minutes with the patient via telehealth technology discussing the above problems/concerns.    Evelina Dun, FNP

## 2021-01-08 ENCOUNTER — Encounter: Payer: Self-pay | Admitting: Emergency Medicine

## 2021-01-08 ENCOUNTER — Telehealth: Payer: 59 | Admitting: Emergency Medicine

## 2021-01-08 DIAGNOSIS — J208 Acute bronchitis due to other specified organisms: Secondary | ICD-10-CM | POA: Diagnosis not present

## 2021-01-08 DIAGNOSIS — B9689 Other specified bacterial agents as the cause of diseases classified elsewhere: Secondary | ICD-10-CM | POA: Diagnosis not present

## 2021-01-08 MED ORDER — AZITHROMYCIN 250 MG PO TABS: ORAL_TABLET | ORAL | 0 refills | Status: AC

## 2021-01-08 NOTE — Patient Instructions (Signed)
  Charlies Lopez, thank you for joining [name], PA-C for today's virtual visit.  While this provider is not your primary care provider (PCP), if your PCP is located in our provider database this encounter information will be shared with them immediately following your visit.  Consent: (Patient) Noah Lopez provided verbal consent for this virtual visit at the beginning of the encounter.  Current Medications:  Current Outpatient Medications:    azithromycin (ZITHROMAX) 250 MG tablet, Take 2 tablets on day 1, then 1 tablet daily on days 2 through 5, Disp: 6 tablet, Rfl: 0   benzonatate (TESSALON PERLES) 100 MG capsule, Take 1 capsule (100 mg total) by mouth 3 (three) times daily as needed., Disp: 20 capsule, Rfl: 0   DEXILANT 60 MG capsule, Take 1 capsule (60 mg total) by mouth daily., Disp: 90 capsule, Rfl: 0   loratadine (CLARITIN) 10 MG tablet, Take 10 mg by mouth daily as needed for allergies. , Disp: , Rfl:    predniSONE (STERAPRED UNI-PAK 21 TAB) 10 MG (21) TBPK tablet, Use as directed, Disp: 21 tablet, Rfl: 0   promethazine-dextromethorphan (PROMETHAZINE-DM) 6.25-15 MG/5ML syrup, Take 5 mLs by mouth 3 (three) times daily as needed for cough., Disp: 118 mL, Rfl: 0   Medications ordered in this encounter:  Meds ordered this encounter  Medications   azithromycin (ZITHROMAX) 250 MG tablet    Sig: Take 2 tablets on day 1, then 1 tablet daily on days 2 through 5    Dispense:  6 tablet    Refill:  0    Order Specific Question:   Supervising Provider    Answer:   Noemi Chapel [[id number]]     *If you need refills on other medications prior to your next appointment, please contact your pharmacy*  Follow-Up: Call back or seek an in-person evaluation if the symptoms worsen or if the condition fails to improve as anticipated.  Other Instructions Get plenty of rest and push fluids Azithromycin for possible bacterial bronchitis Use OTC zyrtec for nasal congestion, runny nose, and/or  sore throat Use OTC flonase for nasal congestion and runny nose Use medications daily for symptom relief Use OTC medications like ibuprofen or tylenol as needed fever or pain Follow up in person with urgent care or go to the ED if you have any new or worsening symptoms such as fever, worsening cough, shortness of breath, chest tightness, chest pain, turning blue, changes in mental status, etc...    If you have been instructed to have an in-person evaluation today at a local Urgent Care facility, please use the link below. It will take you to a list of all of our available [facility], including address, phone number and hours of operation. Please do not delay care.  [facility]  If you or a family member do not have a primary care provider, use the link below to schedule a visit and establish care. When you choose a [facility] primary care physician or advanced practice provider, you gain a long-term partner in health. Find a Primary Care Provider  Learn more about [facility]'s in-office and virtual care options: [facility]

## 2021-01-08 NOTE — Progress Notes (Signed)
Virtual Visit Consent   Noah Lopez, you are scheduled for a virtual visit with a [facility] provider today.     Just as with appointments in the office, your consent must be obtained to participate.  Your consent will be active for this visit and any virtual visit you may have with one of our providers in the next 365 days.     If you have a MyChart account, a copy of this consent can be sent to you electronically.  All virtual visits are billed to your insurance company just like a traditional visit in the office.    As this is a virtual visit, video technology does not allow for your provider to perform a traditional examination.  This may limit your provider's ability to fully assess your condition.  If your provider identifies any concerns that need to be evaluated in person or the need to arrange testing (such as labs, EKG, etc.), we will make arrangements to do so.     Although advances in technology are sophisticated, we cannot ensure that it will always work on either your end or our end.  If the connection with a video visit is poor, the visit may have to be switched to a telephone visit.  With either a video or telephone visit, we are not always able to ensure that we have a secure connection.     I need to obtain your verbal consent now.   Are you willing to proceed with your visit today?    [name] has provided verbal consent on 01/08/2021 for a virtual visit (video or telephone).   Noah Lopez, [place]   Date: 01/08/2021 12:34 PM   Virtual Visit via Video Note   I, Noah Lopez, connected with  Noah Lopez  ([id number], 10-20-1975) on 01/08/21 at 12:30 PM EDT by a video-enabled telemedicine application and verified that I am speaking with the correct person using two identifiers.  Location: Patient: Virtual Visit Location Patient: Home Provider: Virtual Visit Location Provider: Home Office   I discussed the limitations of evaluation and management by  telemedicine and the availability of in person appointments. The patient expressed understanding and agreed to proceed.    History of Present Illness: Noah Lopez is a 45 y.o. who identifies as a male who was assigned male at birth, and is being seen today for runny nose, congestion, sore throat, and productive cough x 2 weeks.  Denies sick exposure to COVID, flu or strep.  Has tried prednisone, cough syrup, and tessalon with minimal relief.  Symptoms are made worse at night  Denies previous symptoms in the past.   Denies fever, chills, SOB, wheezing, chest pain, nausea, changes in bowel or bladder habits.    ROS: As per HPI.  All other pertinent ROS negative.     HPI: HPI  Problems:  Patient Active Problem List   Diagnosis Date Noted   Seasonal allergies 01/17/2016   Arthritis 01/17/2016    Allergies:  Allergies  Allergen Reactions   Penicillins Anaphylaxis   Medications:  Current Outpatient Medications:    azithromycin (ZITHROMAX) 250 MG tablet, Take 2 tablets on day 1, then 1 tablet daily on days 2 through 5, Disp: 6 tablet, Rfl: 0   benzonatate (TESSALON PERLES) 100 MG capsule, Take 1 capsule (100 mg total) by mouth 3 (three) times daily as needed., Disp: 20 capsule, Rfl: 0   DEXILANT 60 MG capsule, Take 1 capsule (60 mg total) by mouth daily., Disp: 90 capsule, Rfl:  0   loratadine (CLARITIN) 10 MG tablet, Take 10 mg by mouth daily as needed for allergies. , Disp: , Rfl:    predniSONE (STERAPRED UNI-PAK 21 TAB) 10 MG (21) TBPK tablet, Use as directed, Disp: 21 tablet, Rfl: 0   promethazine-dextromethorphan (PROMETHAZINE-DM) 6.25-15 MG/5ML syrup, Take 5 mLs by mouth 3 (three) times daily as needed for cough., Disp: 118 mL, Rfl: 0  Observations/Objective: Patient is well-developed, well-nourished in no acute distress.  Resting comfortably at home. Mildly fatigued appearing, but nontoxic. Mild cough present Head is normocephalic, atraumatic.  No labored breathing. Speech is clear  and coherent with logical content.  Patient is alert and oriented at baseline.   Assessment and Plan: 1. Acute bacterial bronchitis Get plenty of rest and push fluids Azithromycin for possible bacterial bronchitis Use OTC zyrtec for nasal congestion, runny nose, and/or sore throat Use OTC flonase for nasal congestion and runny nose Use medications daily for symptom relief Use OTC medications like ibuprofen or tylenol as needed fever or pain Follow up in person with urgent care or go to the ED if you have any new or worsening symptoms such as fever, worsening cough, shortness of breath, chest tightness, chest pain, turning blue, changes in mental status, etc...   Follow Up Instructions: I discussed the assessment and treatment plan with the patient. The patient was provided an opportunity to ask questions and all were answered. The patient agreed with the plan and demonstrated an understanding of the instructions.  A copy of instructions were sent to the patient via MyChart unless otherwise noted below.   The patient was advised to call back or seek an in-person evaluation if the symptoms worsen or if the condition fails to improve as anticipated.  Time:  I spent 10 minutes with the patient via telehealth technology discussing the above problems/concerns.    Noah Box, PA-C

## 2021-06-01 ENCOUNTER — Other Ambulatory Visit (HOSPITAL_COMMUNITY): Payer: Self-pay

## 2021-06-20 ENCOUNTER — Ambulatory Visit: Payer: Self-pay | Admitting: Physician Assistant

## 2021-06-20 ENCOUNTER — Encounter: Payer: Self-pay | Admitting: Physician Assistant

## 2021-06-20 ENCOUNTER — Ambulatory Visit: Payer: Self-pay

## 2021-06-20 ENCOUNTER — Other Ambulatory Visit: Payer: Self-pay

## 2021-06-20 VITALS — BP 120/89 | HR 62 | Temp 98.2°F | Resp 14 | Ht 72.0 in | Wt 218.0 lb

## 2021-06-20 DIAGNOSIS — Z021 Encounter for pre-employment examination: Secondary | ICD-10-CM

## 2021-06-20 LAB — POCT URINALYSIS DIPSTICK
Bilirubin, UA: NEGATIVE
Blood, UA: NEGATIVE
Glucose, UA: NEGATIVE
Ketones, UA: NEGATIVE
Leukocytes, UA: NEGATIVE
Nitrite, UA: NEGATIVE
Protein, UA: NEGATIVE
Spec Grav, UA: 1.015 (ref 1.010–1.025)
Urobilinogen, UA: 0.2 E.U./dL
pH, UA: 6 (ref 5.0–8.0)

## 2021-06-20 NOTE — Progress Notes (Signed)
? ?[facility] ? ?____________________________________________ ? ? None  ?  (approximate) ? ?I have reviewed the triage vital signs and the nursing notes. ? ? ?HISTORY ? ?Chief Complaint ?Employment Physical ? ? ?HPI ?Noah Lopez is a 46 y.o. 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  Patient voices no concerns or complaints.  Past medical history remarkable for left knee surgery x1 year ago.  No sequela. ?   ? ?  ? ? ?Past Medical History:  ?Diagnosis Date  ? Allergy   ? Arthritis   ? hands  ? GERD (gastroesophageal reflux disease)   ? ? ?Patient Active Problem List  ? Diagnosis Date Noted  ? Seasonal allergies 01/17/2016  ? Arthritis 01/17/2016  ? ? ?Past Surgical History:  ?Procedure Laterality Date  ? DRUG INDUCED ENDOSCOPY    ? KNEE ARTHROSCOPY WITH MEDIAL MENISECTOMY Left 09/08/2020  ? Procedure: KNEE ARTHROSCOPY WITH MEDIAL MENISECTOMY;  Surgeon: Hiram Gash, MD;  Location: [facility];  Service: Orthopedics;  Laterality: Left;  ? ? ?Prior to Admission medications   ?Medication Sig Start Date End Date Taking? Authorizing Provider  ?DEXILANT 60 MG capsule Take 1 capsule (60 mg total) by mouth daily. 09/17/20  Yes Armbruster, Carlota Raspberry, MD  ?loratadine (CLARITIN) 10 MG tablet Take 10 mg by mouth daily as needed for allergies.    Yes [provider]  ? ? ?Allergies ?Penicillins ? ?Family History  ?Problem Relation Age of Onset  ? Diverticulitis Mother   ? Arthritis Mother   ? GER disease Father   ? Stomach cancer Neg Hx   ? Rectal cancer Neg Hx   ? Diabetes Neg Hx   ? Colitis Neg Hx   ? ? ?Social History ?Social History  ? ?Tobacco Use  ? Smoking status: Never  ? Smokeless tobacco: Never  ?Vaping Use  ? Vaping Use: Never used  ?Substance Use Topics  ? Alcohol use: Yes  ?  Comment: rare  ? Drug use: No  ? ? ?Review of Systems ?Constitutional: No fever/chills ?Eyes: No visual changes. ?ENT: No sore throat. ?Cardiovascular: Denies chest  pain. ?Respiratory: Denies shortness of breath. ?Gastrointestinal: No abdominal pain.  No nausea, no vomiting.  No diarrhea.  No constipation. ?Genitourinary: Negative for dysuria. ?Musculoskeletal: Negative for back pain. ?Skin: Negative for rash. ?Neurological: Negative for headaches, focal weakness or numbness. ?Allergic/Immunilogical: Penicillin ?____________________________________________ ? ? ?PHYSICAL EXAM: ? ?VITAL SIGNS: Temperature 98.2, pulse 62, respiration 14, blood pressure is 120/89, patient 90% O2 sat on room air.  Patient weighs 218 pounds and BMI is 29.57. ?Constitutional: Alert and oriented. Well appearing and in no acute distress. ?Eyes: Conjunctivae are normal. PERRL. EOMI. ?Head: Atraumatic. ?Nose: No congestion/rhinnorhea. ?Mouth/Throat: Mucous membranes are moist.  Oropharynx non-erythematous. ?Neck: No stridor.  No cervical spine tenderness to palpation. ?Hematological/Lymphatic/Immunilogical: No cervical lymphadenopathy. ?Cardiovascular: Normal rate, regular rhythm. Grossly normal heart sounds.  Good peripheral circulation. ?Respiratory: Normal respiratory effort.  No retractions. Lungs CTAB. ?Gastrointestinal: Soft and nontender. No distention. No abdominal bruits. No CVA tenderness. ?Genitourinary: Deferred ?Musculoskeletal: No lower extremity tenderness nor edema.  No joint effusions. ?Neurologic:  Normal speech and language. No gross focal neurologic deficits are appreciated. No gait instability. ?Skin:  Skin is warm, dry and intact. No rash noted. ?Psychiatric: Mood and affect are normal. Speech and behavior are normal. ? ?____________________________________________ ?  ?LABS ? ?______Order: [id number] ?Status: Final result    ?Visible to patient: No (scheduled for 06/20/2021  4:10 PM)    ?  Next appt: None    ?Dx: Physical exam, pre-employment    ?0 Result Notes ?    ?Component Ref Range & Units 15:10  ?Color, UA  yellow   ?Clarity, UA  clear   ?Glucose, UA Negative Negative   ?Bilirubin,  UA  negative   ?Ketones, UA  negative   ?Spec Grav, UA 1.010 - 1.025 1.015   ?Blood, UA  negative   ?pH, UA 5.0 - 8.0 6.0   ?Protein, UA Negative Negative   ?Urobilinogen, UA 0.2 or 1.0 E.U./dL 0.2   ?Nitrite, UA  negative   ?Leukocytes, UA Negative Negative   ?Appearance    ?    ?  ?__________________________________ ? ?EKG ?Sinus  Rhythm at 65 beats per-RSR(V1) -nondiagnostic.  ? ?PROBABLY NORMAL ? ?____________________________________________ ? ? ? ?____________________________________________ ? ? ?INITIAL IMPRESSION / ASSESSMENT AND PLAN  ?As part of my medical decision making, I reviewed the following data within the [place]  ? ?   ?Lab results pending. ?  ? ? ? ? ? ?____________________________________________ ? ? ?FINAL CLINICAL IMPRESSION ? ? ?ED Discharge Orders   ? ? None  ? ?  ? ? ? ?Note:  This document was prepared using Dragon voice recognition software and may include unintentional dictation errors. ? ?

## 2021-06-20 NOTE — Progress Notes (Signed)
Pt presents today for pre-employment uds and physical.  ?

## 2021-06-20 NOTE — Progress Notes (Signed)
Pt denies any issues or concerns at this time/CL,RMA ?

## 2021-06-20 NOTE — Progress Notes (Signed)
Baseline hearing screen is WNL.   ? ?Patient does have hearing aids, but says he doesn't need them at work.  He's had them for 6-7 years. 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 ?Said it Hereditary - maternal males have hearing issues & require hearing aids. ? ?[facility] in [place] is who he goes to. ? ?AMD ?

## 2021-06-21 LAB — CMP12+LP+TP+TSH+6AC+PSA+CBC…
ALT: 19 IU/L (ref 0–44)
AST: 18 IU/L (ref 0–40)
Albumin/Globulin Ratio: 2 (ref 1.2–2.2)
Albumin: 4.7 g/dL (ref 4.0–5.0)
Alkaline Phosphatase: 75 IU/L (ref 44–121)
BUN/Creatinine Ratio: 13 (ref 9–20)
BUN: 15 mg/dL (ref 6–24)
Basophils Absolute: 0.1 10*3/uL (ref 0.0–0.2)
Basos: 1 %
Bilirubin Total: 0.8 mg/dL (ref 0.0–1.2)
Calcium: 9.8 mg/dL (ref 8.7–10.2)
Chloride: 106 mmol/L (ref 96–106)
Chol/HDL Ratio: 3.9 ratio (ref 0.0–5.0)
Cholesterol, Total: 163 mg/dL (ref 100–199)
Creatinine, Ser: 1.12 mg/dL (ref 0.76–1.27)
EOS (ABSOLUTE): 0.3 10*3/uL (ref 0.0–0.4)
Eos: 4 %
Estimated CHD Risk: 0.7 times avg. (ref 0.0–1.0)
Free Thyroxine Index: 1.8 (ref 1.2–4.9)
GGT: 17 IU/L (ref 0–65)
Globulin, Total: 2.3 g/dL (ref 1.5–4.5)
Glucose: 74 mg/dL (ref 70–99)
HDL: 42 mg/dL (ref >39)
Hematocrit: 44 % (ref 37.5–51.0)
Hemoglobin: 14.3 g/dL (ref 13.0–17.7)
Immature Grans (Abs): 0 10*3/uL (ref 0.0–0.1)
Immature Granulocytes: 0 %
Iron: 136 ug/dL (ref 38–169)
LDH: 180 IU/L (ref 121–224)
LDL Chol Calc (NIH): 110 mg/dL — ABNORMAL HIGH (ref 0–99)
Lymphocytes Absolute: 1.7 10*3/uL (ref 0.7–3.1)
Lymphs: 24 %
MCH: 26.3 pg — ABNORMAL LOW (ref 26.6–33.0)
MCHC: 32.5 g/dL (ref 31.5–35.7)
MCV: 81 fL (ref 79–97)
Monocytes Absolute: 0.6 10*3/uL (ref 0.1–0.9)
Monocytes: 8 %
Neutrophils Absolute: 4.4 10*3/uL (ref 1.4–7.0)
Neutrophils: 63 %
Phosphorus: 2.4 mg/dL — ABNORMAL LOW (ref 2.8–4.1)
Platelets: 298 10*3/uL (ref 150–450)
Potassium: 4.6 mmol/L (ref 3.5–5.2)
Prostate Specific Ag, Serum: 0.6 ng/mL (ref 0.0–4.0)
RBC: 5.44 x10E6/uL (ref 4.14–5.80)
RDW: 13.3 % (ref 11.6–15.4)
Sodium: 143 mmol/L (ref 134–144)
T3 Uptake Ratio: 26 % (ref 24–39)
T4, Total: 6.8 ug/dL (ref 4.5–12.0)
TSH: 2.73 u[IU]/mL (ref 0.450–4.500)
Total Protein: 7 g/dL (ref 6.0–8.5)
Triglycerides: 57 mg/dL (ref 0–149)
Uric Acid: 6.1 mg/dL (ref 3.8–8.4)
VLDL Cholesterol Cal: 11 mg/dL (ref 5–40)
WBC: 7.1 10*3/uL (ref 3.4–10.8)
eGFR: 83 mL/min/{1.73_m2} (ref >59)

## 2021-06-21 LAB — HEPATITIS B SURFACE ANTIBODY,QUALITATIVE: Hep B Surface Ab, Qual: REACTIVE

## 2021-09-08 DIAGNOSIS — M9905 Segmental and somatic dysfunction of pelvic region: Secondary | ICD-10-CM | POA: Diagnosis not present

## 2021-09-08 DIAGNOSIS — M9904 Segmental and somatic dysfunction of sacral region: Secondary | ICD-10-CM | POA: Diagnosis not present

## 2021-09-08 DIAGNOSIS — M9903 Segmental and somatic dysfunction of lumbar region: Secondary | ICD-10-CM | POA: Diagnosis not present

## 2021-09-08 DIAGNOSIS — M9902 Segmental and somatic dysfunction of thoracic region: Secondary | ICD-10-CM | POA: Diagnosis not present

## 2021-09-08 DIAGNOSIS — M7918 Myalgia, other site: Secondary | ICD-10-CM | POA: Diagnosis not present

## 2021-09-08 DIAGNOSIS — M5441 Lumbago with sciatica, right side: Secondary | ICD-10-CM | POA: Diagnosis not present

## 2021-09-14 DIAGNOSIS — M7918 Myalgia, other site: Secondary | ICD-10-CM | POA: Diagnosis not present

## 2021-09-14 DIAGNOSIS — M9903 Segmental and somatic dysfunction of lumbar region: Secondary | ICD-10-CM | POA: Diagnosis not present

## 2021-09-14 DIAGNOSIS — M5441 Lumbago with sciatica, right side: Secondary | ICD-10-CM | POA: Diagnosis not present

## 2021-09-14 DIAGNOSIS — M9904 Segmental and somatic dysfunction of sacral region: Secondary | ICD-10-CM | POA: Diagnosis not present

## 2021-09-14 DIAGNOSIS — M9905 Segmental and somatic dysfunction of pelvic region: Secondary | ICD-10-CM | POA: Diagnosis not present

## 2021-09-14 DIAGNOSIS — M9902 Segmental and somatic dysfunction of thoracic region: Secondary | ICD-10-CM | POA: Diagnosis not present

## 2022-06-09 ENCOUNTER — Encounter: Payer: Self-pay | Admitting: Emergency Medicine

## 2022-06-09 ENCOUNTER — Other Ambulatory Visit: Payer: Self-pay

## 2022-06-09 ENCOUNTER — Emergency Department
Admission: EM | Admit: 2022-06-09 | Discharge: 2022-06-09 | Disposition: A | Discharge status: Home / Self Care | Payer: No Typology Code available for payment source | Attending: Emergency Medicine | Admitting: Emergency Medicine

## 2022-06-09 DIAGNOSIS — S51852A Open bite of left forearm, initial encounter: Secondary | ICD-10-CM | POA: Insufficient documentation

## 2022-06-09 DIAGNOSIS — W503XXA Accidental bite by another person, initial encounter: Secondary | ICD-10-CM | POA: Insufficient documentation

## 2022-06-09 DIAGNOSIS — Y990 Civilian activity done for income or pay: Secondary | ICD-10-CM | POA: Diagnosis not present

## 2022-06-09 LAB — CBC WITH DIFFERENTIAL/PLATELET
Abs Immature Granulocytes: 0.02 10*3/uL (ref 0.00–0.07)
Basophils Absolute: 0.1 10*3/uL (ref 0.0–0.1)
Basophils Relative: 1 %
Eosinophils Absolute: 0.2 10*3/uL (ref 0.0–0.5)
Eosinophils Relative: 3 %
HCT: 46.5 % (ref 39.0–52.0)
Hemoglobin: 14.7 g/dL (ref 13.0–17.0)
Immature Granulocytes: 0 %
Lymphocytes Relative: 23 %
Lymphs Abs: 1.4 10*3/uL (ref 0.7–4.0)
MCH: 26.4 pg (ref 26.0–34.0)
MCHC: 31.6 g/dL (ref 30.0–36.0)
MCV: 83.5 fL (ref 80.0–100.0)
Monocytes Absolute: 0.4 10*3/uL (ref 0.1–1.0)
Monocytes Relative: 7 %
Neutro Abs: 3.9 10*3/uL (ref 1.7–7.7)
Neutrophils Relative %: 66 %
Platelets: 302 10*3/uL (ref 150–400)
RBC: 5.57 MIL/uL (ref 4.22–5.81)
RDW: 13.4 % (ref 11.5–15.5)
WBC: 6 10*3/uL (ref 4.0–10.5)
nRBC: 0 % (ref 0.0–0.2)

## 2022-06-09 LAB — COMPREHENSIVE METABOLIC PANEL
ALT: 24 U/L (ref 0–44)
AST: 23 U/L (ref 15–41)
Albumin: 4.8 g/dL (ref 3.5–5.0)
Alkaline Phosphatase: 70 U/L (ref 38–126)
Anion gap: 12 (ref 5–15)
BUN: 20 mg/dL (ref 6–20)
CO2: 22 mmol/L (ref 22–32)
Calcium: 9.9 mg/dL (ref 8.9–10.3)
Chloride: 106 mmol/L (ref 98–111)
Creatinine, Ser: 1.29 mg/dL — ABNORMAL HIGH (ref 0.61–1.24)
GFR, Estimated: >60 mL/min (ref >60)
Glucose, Bld: 98 mg/dL (ref 70–99)
Potassium: 4.2 mmol/L (ref 3.5–5.1)
Sodium: 140 mmol/L (ref 135–145)
Total Bilirubin: 1.2 mg/dL (ref 0.3–1.2)
Total Protein: 7.9 g/dL (ref 6.5–8.1)

## 2022-06-09 MED ORDER — CLINDAMYCIN HCL 150 MG PO CAPS
450.0000 mg | ORAL_CAPSULE | Freq: Three times a day (TID) | ORAL | 0 refills | Status: AC
  Filled 2022-06-09: qty 63, 7d supply, fill #0

## 2022-06-09 MED ORDER — SULFAMETHOXAZOLE-TRIMETHOPRIM 800-160 MG PO TABS
1.0000 | ORAL_TABLET | Freq: Two times a day (BID) | ORAL | 0 refills | Status: AC
  Filled 2022-06-09: qty 14, 7d supply, fill #0

## 2022-06-09 NOTE — ED Notes (Signed)
Exposed forms and blood have been sent to the lab at this time.
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
## 2022-06-09 NOTE — Discharge Instructions (Signed)
Keep the area clean with soap and water.  You did not receive a tetanus shot as you report that you think that you have received it within the past year.  You may take the medications as prescribed.  Please return for any new, worsening, or change in symptoms or other concerns. It was a pleasure caring for you today.
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
## 2022-06-12 ENCOUNTER — Other Ambulatory Visit: Payer: Self-pay

## 2022-07-21 ENCOUNTER — Ambulatory Visit: Payer: Self-pay

## 2022-07-21 DIAGNOSIS — Z0000 Encounter for general adult medical examination without abnormal findings: Secondary | ICD-10-CM

## 2022-07-21 LAB — POCT URINALYSIS DIPSTICK
Bilirubin, UA: NEGATIVE
Blood, UA: NEGATIVE
Glucose, UA: NEGATIVE
Ketones, UA: NEGATIVE
Leukocytes, UA: NEGATIVE
Nitrite, UA: NEGATIVE
Protein, UA: NEGATIVE
Spec Grav, UA: 1.015 (ref 1.010–1.025)
Urobilinogen, UA: 0.2 E.U./dL
pH, UA: 6 (ref 5.0–8.0)

## 2022-07-22 LAB — CMP12+LP+TP+TSH+6AC+CBC/D/PLT
ALT: 45 IU/L — ABNORMAL HIGH (ref 0–44)
AST: 32 IU/L (ref 0–40)
Albumin/Globulin Ratio: 1.6 (ref 1.2–2.2)
Albumin: 4.1 g/dL (ref 4.1–5.1)
Alkaline Phosphatase: 93 IU/L (ref 44–121)
BUN/Creatinine Ratio: 18 (ref 9–20)
BUN: 12 mg/dL (ref 6–24)
Basophils Absolute: 0.1 10*3/uL (ref 0.0–0.2)
Basos: 1 %
Bilirubin Total: 0.7 mg/dL (ref 0.0–1.2)
Calcium: 9.6 mg/dL (ref 8.7–10.2)
Chloride: 102 mmol/L (ref 96–106)
Chol/HDL Ratio: 3.7 ratio (ref 0.0–5.0)
Cholesterol, Total: 163 mg/dL (ref 100–199)
Creatinine, Ser: 0.68 mg/dL — ABNORMAL LOW (ref 0.76–1.27)
EOS (ABSOLUTE): 0.3 10*3/uL (ref 0.0–0.4)
Eos: 4 %
Estimated CHD Risk: 0.6 times avg. (ref 0.0–1.0)
Free Thyroxine Index: 9.1 — ABNORMAL HIGH (ref 1.2–4.9)
GGT: 32 IU/L (ref 0–65)
Globulin, Total: 2.5 g/dL (ref 1.5–4.5)
Glucose: 91 mg/dL (ref 70–99)
HDL: 44 mg/dL (ref >39)
Hematocrit: 45.7 % (ref 37.5–51.0)
Hemoglobin: 14.8 g/dL (ref 13.0–17.7)
Immature Grans (Abs): 0.1 10*3/uL (ref 0.0–0.1)
Immature Granulocytes: 1 %
Iron: 148 ug/dL (ref 38–169)
LDH: 163 IU/L (ref 121–224)
LDL Chol Calc (NIH): 104 mg/dL — ABNORMAL HIGH (ref 0–99)
Lymphocytes Absolute: 1.7 10*3/uL (ref 0.7–3.1)
Lymphs: 26 %
MCH: 26.6 pg (ref 26.6–33.0)
MCHC: 32.4 g/dL (ref 31.5–35.7)
MCV: 82 fL (ref 79–97)
Monocytes Absolute: 0.7 10*3/uL (ref 0.1–0.9)
Monocytes: 10 %
Neutrophils Absolute: 3.7 10*3/uL (ref 1.4–7.0)
Neutrophils: 58 %
Phosphorus: 3.9 mg/dL (ref 2.8–4.1)
Platelets: 277 10*3/uL (ref 150–450)
Potassium: 4.2 mmol/L (ref 3.5–5.2)
RBC: 5.56 x10E6/uL (ref 4.14–5.80)
RDW: 13.6 % (ref 11.6–15.4)
Sodium: 139 mmol/L (ref 134–144)
T3 Uptake Ratio: 48 % — ABNORMAL HIGH (ref 24–39)
T4, Total: 19 ug/dL (ref 4.5–12.0)
TSH: <0.005 u[IU]/mL — ABNORMAL LOW (ref 0.450–4.500)
Total Protein: 6.6 g/dL (ref 6.0–8.5)
Triglycerides: 76 mg/dL (ref 0–149)
Uric Acid: 4.7 mg/dL (ref 3.8–8.4)
VLDL Cholesterol Cal: 15 mg/dL (ref 5–40)
WBC: 6.4 10*3/uL (ref 3.4–10.8)
eGFR: 117 mL/min/{1.73_m2} (ref >59)

## 2022-08-15 ENCOUNTER — Ambulatory Visit: Payer: Self-pay | Admitting: Physician Assistant

## 2022-09-18 ENCOUNTER — Ambulatory Visit: Payer: Self-pay | Admitting: Emergency Medicine

## 2022-09-18 ENCOUNTER — Encounter: Payer: Self-pay | Admitting: Emergency Medicine

## 2022-09-18 VITALS — BP 109/71 | HR 66 | Temp 98.2°F | Resp 12 | Ht 72.0 in | Wt 226.0 lb

## 2022-09-18 DIAGNOSIS — R7989 Other specified abnormal findings of blood chemistry: Secondary | ICD-10-CM
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
## 2022-09-19 LAB — THYROID PANEL WITH TSH
Free Thyroxine Index: 1.6 (ref 1.2–4.9)
T3 Uptake Ratio: 26 % (ref 24–39)
T4, Total: 6.1 ug/dL (ref 4.5–12.0)
TSH: 1.64 u[IU]/mL (ref 0.450–4.500)

## 2023-07-10 ENCOUNTER — Ambulatory Visit: Payer: Self-pay

## 2023-07-10 DIAGNOSIS — Z0000 Encounter for general adult medical examination without abnormal findings: Secondary | ICD-10-CM

## 2023-07-10 LAB — POCT URINALYSIS DIPSTICK
Bilirubin, UA: NEGATIVE
Blood, UA: NEGATIVE
Glucose, UA: NEGATIVE
Ketones, UA: NEGATIVE
Leukocytes, UA: NEGATIVE
Nitrite, UA: NEGATIVE
Protein, UA: NEGATIVE
Spec Grav, UA: 1.01 (ref 1.010–1.025)
Urobilinogen, UA: 0.2 U/dL
pH, UA: 6 (ref 5.0–8.0)

## 2023-07-10 NOTE — Progress Notes (Signed)
 Pt completed labs for physical. Noah Lopez

## 2023-07-11 LAB — CMP12+LP+TP+TSH+6AC+PSA+CBC…
ALT: 26 IU/L (ref 0–44)
AST: 19 IU/L (ref 0–40)
Albumin: 4.5 g/dL (ref 4.1–5.1)
Alkaline Phosphatase: 79 IU/L (ref 44–121)
BUN/Creatinine Ratio: 13 (ref 9–20)
BUN: 14 mg/dL (ref 6–24)
Basophils Absolute: 0.1 10*3/uL (ref 0.0–0.2)
Basos: 1 %
Bilirubin Total: 0.6 mg/dL (ref 0.0–1.2)
Calcium: 9.4 mg/dL (ref 8.7–10.2)
Chloride: 104 mmol/L (ref 96–106)
Chol/HDL Ratio: 4.4 ratio (ref 0.0–5.0)
Cholesterol, Total: 176 mg/dL (ref 100–199)
Creatinine, Ser: 1.1 mg/dL (ref 0.76–1.27)
EOS (ABSOLUTE): 0.3 10*3/uL (ref 0.0–0.4)
Eos: 4 %
Estimated CHD Risk: 0.9 times avg. (ref 0.0–1.0)
Free Thyroxine Index: 1.8 (ref 1.2–4.9)
GGT: 16 IU/L (ref 0–65)
Globulin, Total: 2.3 g/dL (ref 1.5–4.5)
Glucose: 76 mg/dL (ref 70–99)
HDL: 40 mg/dL (ref >39)
Hematocrit: 45.1 % (ref 37.5–51.0)
Hemoglobin: 14.2 g/dL (ref 13.0–17.7)
Immature Grans (Abs): 0.1 10*3/uL (ref 0.0–0.1)
Immature Granulocytes: 1 %
Iron: 104 ug/dL (ref 38–169)
LDH: 171 IU/L (ref 121–224)
LDL Chol Calc (NIH): 121 mg/dL — ABNORMAL HIGH (ref 0–99)
Lymphocytes Absolute: 1.8 10*3/uL (ref 0.7–3.1)
Lymphs: 26 %
MCH: 26 pg — ABNORMAL LOW (ref 26.6–33.0)
MCHC: 31.5 g/dL (ref 31.5–35.7)
MCV: 83 fL (ref 79–97)
Monocytes Absolute: 0.6 10*3/uL (ref 0.1–0.9)
Monocytes: 9 %
Neutrophils Absolute: 4 10*3/uL (ref 1.4–7.0)
Neutrophils: 59 %
Phosphorus: 2.9 mg/dL (ref 2.8–4.1)
Platelets: 313 10*3/uL (ref 150–450)
Potassium: 4.5 mmol/L (ref 3.5–5.2)
Prostate Specific Ag, Serum: 0.6 ng/mL (ref 0.0–4.0)
RBC: 5.46 x10E6/uL (ref 4.14–5.80)
RDW: 13.5 % (ref 11.6–15.4)
Sodium: 141 mmol/L (ref 134–144)
T3 Uptake Ratio: 27 % (ref 24–39)
T4, Total: 6.6 ug/dL (ref 4.5–12.0)
TSH: 3.3 u[IU]/mL (ref 0.450–4.500)
Total Protein: 6.8 g/dL (ref 6.0–8.5)
Triglycerides: 82 mg/dL (ref 0–149)
Uric Acid: 5.3 mg/dL (ref 3.8–8.4)
VLDL Cholesterol Cal: 15 mg/dL (ref 5–40)
WBC: 6.8 10*3/uL (ref 3.4–10.8)
eGFR: 84 mL/min/{1.73_m2} (ref >59)

## 2023-07-17 ENCOUNTER — Ambulatory Visit: Payer: Self-pay | Admitting: Physician Assistant

## 2023-07-17 ENCOUNTER — Encounter: Payer: Self-pay | Admitting: Physician Assistant

## 2023-07-17 VITALS — BP 113/69 | HR 81 | Resp 12 | Ht 72.0 in | Wt 213.0 lb

## 2023-07-17 DIAGNOSIS — Z0000 Encounter for general adult medical examination without abnormal findings: Secondary | ICD-10-CM

## 2023-07-17 NOTE — Progress Notes (Signed)
 [facility] clinic ____________________________________________   None    (approximate)  I have reviewed the triage vital signs and the nursing notes.   HISTORY  Chief Complaint No chief complaint on file.   HPI Noah Lopez is a 48 y.o. male          Past Medical History:  Diagnosis Date   Allergy    Arthritis    hands   GERD (gastroesophageal reflux disease)     Patient Active Problem List   Diagnosis Date Noted   Seasonal allergies 01/17/2016   Arthritis 01/17/2016    Past Surgical History:  Procedure Laterality Date   DRUG INDUCED ENDOSCOPY     KNEE ARTHROSCOPY WITH MEDIAL MENISECTOMY Left 09/08/2020   Procedure: KNEE ARTHROSCOPY WITH MEDIAL MENISECTOMY;  Surgeon: Micheline Ahr, MD;  Location: [facility];  Service: Orthopedics;  Laterality: Left;    Prior to Admission medications   Medication Sig Start Date End Date Taking? Authorizing Provider  DEXILANT  60 MG capsule Take 1 capsule (60 mg total) by mouth daily. 09/17/20   Armbruster, Lendon Queen, MD  loratadine (CLARITIN) 10 MG tablet Take 10 mg by mouth daily as needed for allergies.     [provider]    Allergies Penicillins  Family History  Problem Relation Age of Onset   Diverticulitis Mother    Arthritis Mother    GER disease Father    Stomach cancer Neg Hx    Rectal cancer Neg Hx    Diabetes Neg Hx    Colitis Neg Hx     Social History Social History   Tobacco Use   Smoking status: Never   Smokeless tobacco: Never  Vaping Use   Vaping status: Never Used  Substance Use Topics   Alcohol use: Yes    Comment: rare   Drug use: No    Review of Systems Constitutional: No fever/chills Eyes: No visual changes. ENT: No sore throat. Cardiovascular: Denies chest pain. Respiratory: Denies shortness of breath. Gastrointestinal: No abdominal pain.  No nausea, no vomiting.  No diarrhea.  No constipation. Genitourinary: Negative for  dysuria. Musculoskeletal: Negative for back pain. Skin: Negative for rash. Neurological: Negative for headaches, focal weakness or numbness.:  Allergic/Immunilogical: Penicillin  ____________________________________________   PHYSICAL EXAM:  VITAL SIGNS: BP 113/69  Pulse Rate 81  Weight 213 lb (96.6 kg)  Height 6' (1.829 m)  Resp 12  SpO2 96 %   Constitutional: Alert and oriented. Well appearing and in no acute distress. Eyes: Conjunctivae are normal. PERRL. EOMI. Head: Atraumatic. Nose: No congestion/rhinnorhea. Mouth/Throat: Mucous membranes are moist.  Oropharynx non-erythematous. Neck: No stridor.  No cervical spine tenderness to palpation. Hematological/Lymphatic/Immunilogical: No cervical lymphadenopathy. Cardiovascular: Normal rate, regular rhythm. Grossly normal heart sounds.  Good peripheral circulation. Respiratory: Normal respiratory effort.  No retractions. Lungs CTAB. Gastrointestinal: Soft and nontender. No distention. No abdominal bruits. No CVA tenderness. Genitourinary: Deferred Musculoskeletal: No lower extremity tenderness nor edema.  No joint effusions. Neurologic:  Normal speech and language. No gross focal neurologic deficits are appreciated. No gait instability. Skin:  Skin is warm, dry and intact. No rash noted. Psychiatric: Mood and affect are normal. Speech and behavior are normal.  ____________________________________________   LABS       Component Ref Range & Units (hover) 7 d ago 12 mo ago 2 yr ago  Color, UA yellow Yellow yellow  Clarity, UA clear Clear clear  Glucose, UA Negative Negative Negative  Bilirubin, UA neg Negative  negative  Ketones, UA neg Negative negative  Spec Grav, UA 1.010 1.015 1.015  Blood, UA neg Negative negative  pH, UA 6.0 6.0 6.0  Protein, UA Negative Negative Negative  Urobilinogen, UA 0.2 0.2 0.2  Nitrite, UA neg Negative negative  Leukocytes, UA Negative Negative Negative  Appearance     Odor                 View All Conversations on this Encounter                 Component Ref Range & Units (hover) 7 d ago (07/10/23) 10 mo ago (09/18/22) 12 mo ago (07/21/22) 1 yr ago (06/09/22) 1 yr ago (06/09/22) 2 yr ago (06/20/21) 7 yr ago (01/17/16) 7 yr ago (01/17/16) 7 yr ago (01/17/16)  Glucose 76  91 98 CM  74 86    Uric Acid 5.3  4.7 CM   6.1 CM     Comment:            Therapeutic target for gout patients: <6.0  BUN 14  12 20  R  15 14 R    Creatinine, Ser 1.10  0.68 Low  1.29 High  R  1.12 1.14 R    eGFR 84  117   83     BUN/Creatinine Ratio 13  18   13      Sodium 141  139 140 R  143 140 R    Potassium 4.5  4.2 4.2 R  4.6 4.2 R    Chloride 104  102 106 R  106 105 R    Calcium 9.4  9.6 9.9 R  9.8 10.0 R    Phosphorus 2.9  3.9   2.4 Low      Total Protein 6.8  6.6 7.9 R  7.0 7.2 R    Albumin 4.5  4.1 4.8 R  4.7 R 4.7 R    Globulin, Total 2.3  2.5   2.3     Bilirubin Total 0.6  0.7 1.2 R  0.8 0.5 R    Alkaline Phosphatase 79  93 70 R  75 55 R    LDH 171  163   180     AST 19  32 23 R  18 17 R    ALT 26  45 High  24 R  19 20 R    GGT 16  32   17     Iron 104  148   136     Cholesterol, Total 176  163   163     Triglycerides 82  76   57     HDL 40  44   42     VLDL Cholesterol Cal 15  15   11      LDL Chol Calc (NIH) 121 High   104 High    110 High      Chol/HDL Ratio 4.4  3.7 CM   3.9 CM     Comment:                                   T. Chol/HDL Ratio                                             Men  Women  1/2 Avg.Risk  3.4    3.3                                   Avg.Risk  5.0    4.4                                2X Avg.Risk  9.6    7.1                                3X Avg.Risk 23.4   11.0  Estimated CHD Risk 0.9  0.6 CM   0.7 CM     Comment: The CHD Risk is based on the T. Chol/HDL ratio. Other factors affect CHD Risk such as hypertension, smoking, diabetes, severe obesity, and family history of premature CHD.  TSH 3.300 1.640 <0.005 Low     2.730  2.55 R   T4, Total 6.6 6.1 19.0 High Panic    6.8     T3 Uptake Ratio 27 26 48 High    26     Free Thyroxine Index 1.8 1.6 9.1 High    1.8     Prostate Specific Ag, Serum 0.6     0.6 CM     Comment: Roche ECLIA methodology. According to the American Urological Association, Serum PSA should decrease and remain at undetectable levels after radical prostatectomy. The AUA defines biochemical recurrence as an initial PSA value 0.2 ng/mL or greater followed by a subsequent confirmatory PSA value 0.2 ng/mL or greater. Values obtained with different assay methods or kits cannot be used interchangeably. Results cannot be interpreted as absolute evidence of the presence or absence of malignant disease.  WBC 6.8  6.4  6.0 R 7.1   6.8 R  RBC 5.46  5.56  5.57 R 5.44   5.17 R  Hemoglobin 14.2  14.8  14.7 R 14.3   13.8 R  Hematocrit 45.1  45.7  46.5 R 44.0   41.9 R  MCV 83  82  83.5 R 81   81.0 R  MCH 26.0 Low   26.6  26.4 R 26.3 Low      MCHC 31.5  32.4  31.6 R 32.5   32.9 R  RDW 13.5  13.6  13.4 R 13.3   13.8 R  Platelets 313  277  302 R 298   239.0 R  Neutrophils 59  58  66 R 63     Lymphs 26  26   24      Monocytes 9  10   8      Eos 4  4   4      Basos 1  1   1      Neutrophils Absolute 4.0  3.7  3.9 R 4.4     Lymphocytes Absolute 1.8  1.7  1.4 R 1.7     Monocytes Absolute 0.6  0.7   0.6     EOS (ABSOLUTE) 0.3  0.3   0.3     Basophils Absolute 0.1  0.1  0.1 R 0.1     Immature Granulocytes 1  1  0 R 0     Immature Grans (Abs) 0.1  0.1   0.0                  ____________________________________________  EKG Sinus bradycardia 47 bpm  ____________________________________________    ____________________________________________   INITIAL IMPRESSION / ASSESSMENT AND PLAN   As part of my medical decision making, I reviewed the following data within the electronic MEDICAL RECORD NUMBER Notes from prior ED visits and [place] Controlled Substance Database     No acute findings on physical  exam, EKG, or labs.        ____________________________________________   FINAL CLINICAL IMPRESSION  Well exam   ED Discharge Orders     None        Note:  This document was prepared using Dragon voice recognition software and may include unintentional dictation errors.

## 2023-07-17 NOTE — Progress Notes (Signed)
 Pt presents today to complete physical, Pt denies any issues or concerns at this time/CL,RMA

## 2023-08-21 ENCOUNTER — Ambulatory Visit: Payer: Self-pay | Admitting: Physician Assistant

## 2023-08-21 ENCOUNTER — Other Ambulatory Visit: Payer: Self-pay

## 2023-08-21 ENCOUNTER — Encounter: Payer: Self-pay | Admitting: Physician Assistant

## 2023-08-21 VITALS — BP 115/73 | HR 54 | Temp 97.6°F | Resp 14 | Ht 72.0 in | Wt 220.0 lb

## 2023-08-21 DIAGNOSIS — J189 Pneumonia, unspecified organism: Secondary | ICD-10-CM

## 2023-08-21 MED ORDER — AZITHROMYCIN 250 MG PO TABS
ORAL_TABLET | ORAL | 0 refills | Status: AC
  Filled 2023-08-21: qty 6, 5d supply, fill #0

## 2023-08-21 MED ORDER — METHYLPREDNISOLONE 4 MG PO TBPK
ORAL_TABLET | ORAL | 0 refills | Status: AC
  Filled 2023-08-21: qty 21, 6d supply, fill #0

## 2023-08-21 MED ORDER — BENZONATATE 100 MG PO CAPS
200.0000 mg | ORAL_CAPSULE | Freq: Three times a day (TID) | ORAL | 0 refills | Status: DC
  Filled 2023-08-21: qty 30, 5d supply, fill #0

## 2023-08-21 MED ORDER — FEXOFENADINE-PSEUDOEPHED ER 60-120 MG PO TB12
1.0000 | ORAL_TABLET | Freq: Two times a day (BID) | ORAL | 0 refills | Status: DC
  Filled 2023-08-21: qty 20, 10d supply, fill #0

## 2023-08-21 NOTE — Progress Notes (Signed)
   Subjective:Cold Symptoms    Patient ID: Noah Lopez, male    DOB: [birth year], 48 y.o.   MRN: [id number]  HPI Patient complain 1 week 10 days of dry cough.  Patient states there is "cracking"" inspiration.  Patient also complaining of sinus congestion.  Denies dyspnea.   Review of Systems Seasonal rhinitis    Objective:   Physical Exam BP 115/73  Cuff Size Normal  Pulse Rate 54Pulse Rate. 54. Data is abnormal. Taken on 08/21/23 1:46 PM  Temp 97.6 F (36.4 C)  Temp Source Temporal  Weight 220 lb (99.8 kg)  Height 6' (1.829 m)  Resp 14  SpO2 97 %   BMI: 29.84 kg/m2  BSA: 2.25 m2  HEENT is remarkable edematous nasal turbinates Neck is supple without lymphadenopathy or bruits. Lungs with expiratory rales.  Cough of inspiration.   Heart bradycardia       Assessment & Plan: Pneumonia  Patient given prescription for Z-Pak, Medrol Dosepak, Allegra-D, and Tessalon  Perles.  Asked to follow-up in 1 week no improvement.  Will consider chest x-ray at that time.

## 2023-08-21 NOTE — Progress Notes (Signed)
 S/Sx 1 week-10days: Dry Cough "Crackling in lungs" with inspirations - My wife who is a NP listed to them last night & said I should call today Slight Headache SOB with cough - none noted with activity like climbing a set of stairs Denies ear & teeth discomfort  States using OTC oral decongestant & Flonase  Fever 08/17/23   Review above info with [name], PA-C Requested Noah Lopez come to clinic - scheduled to come today at 1:45 pm.

## 2023-11-05 DIAGNOSIS — H524 Presbyopia: Secondary | ICD-10-CM | POA: Diagnosis not present

## 2024-02-26 ENCOUNTER — Encounter: Payer: Self-pay | Admitting: Physician Assistant

## 2024-02-26 ENCOUNTER — Ambulatory Visit: Payer: Self-pay | Admitting: Physician Assistant

## 2024-02-26 VITALS — BP 118/78 | HR 74 | Temp 97.7°F | Resp 12 | Wt 217.0 lb

## 2024-02-26 DIAGNOSIS — R5383 Other fatigue: Secondary | ICD-10-CM

## 2024-02-26 NOTE — Progress Notes (Signed)
 Scale of 1-10 for energy feels like a 2 after a full night sleep. Changes throughout the day. Completely exhausted by the end of the work day - goes home to couch.  States that's not like me.  Also, if I lay down, I'm out - I cannot keep myself awake.  Has been going on 7-8 months. Discussed with two co-workers who had similar symptoms & they recommended he have his testosterone level checked.  Say they were feeling the same way & now are on testosterone & feeling better.  States he's never had t-level checked.

## 2024-02-26 NOTE — Progress Notes (Signed)
   Subjective: Fatigue    Patient ID: Noah Lopez, male    DOB: Aug 23, 1975, 48 y.o.   MRN: [id number]  HPI Patient complains of 6 to 8 months of fatigue.  Patient states he sleeps a full night wake up feeling tired.  Patient states energy level increases around 8 AM to 3 PM.  Patient states when he go home with 5 PM he is so tired that he sit down in recliner he can also sleep.  Patient states he has lost interest in any physical activities.  States he no decreased libido. Review of Systems  Seasonal allergies    Objective:   Physical Exam BP 118/78  BP Location Left Arm  Patient Position Sitting  Cuff Size Large  Pulse Rate 74  Temp 97.7 F (36.5 C)  Temp Source Temporal  Weight 217 lb (98.4 kg)  Resp 12  SpO2 97 %   BMI: 29.43 kg/m2  BSA: 2.24 m2  HEENT is unremarkable. Neck is supple lymphadenopathy or bruits. Lungs are clear to auscultation. Heart is regular rate and rhythm.       Assessment & Plan: Fatigue  Further evaluation with testosterone levels and vitamin D .  Patient will follow-up in 6 days to discuss lab results.

## 2024-02-27 LAB — TESTOSTERONE,FREE AND TOTAL
Testosterone, Free: 13.9 pg/mL (ref 6.8–21.5)
Testosterone: 227 ng/dL — ABNORMAL LOW (ref 264–916)

## 2024-02-27 LAB — VITAMIN D 25 HYDROXY (VIT D DEFICIENCY, FRACTURES): Vit D, 25-Hydroxy: 33.6 ng/mL (ref 30.0–100.0)

## 2024-03-03 ENCOUNTER — Ambulatory Visit: Payer: Self-pay | Admitting: Physician Assistant

## 2024-03-03 ENCOUNTER — Encounter: Payer: Self-pay | Admitting: Physician Assistant

## 2024-03-03 DIAGNOSIS — R7989 Other specified abnormal findings of blood chemistry: Secondary | ICD-10-CM

## 2024-03-03 DIAGNOSIS — R5383 Other fatigue: Secondary | ICD-10-CM

## 2024-03-03 NOTE — Progress Notes (Signed)
   Subjective: Fatigue    Patient ID: Noah Lopez, male    DOB: 06-04-1975, 48 y.o.   MRN: [id number]  HPI Patient presents for follow-up of complaint of fatigue which has increased in the last 7 to 8 months.  Patient was sent for labs to include vitamin D  and testosterone  levels.  Patient testosterone  level was decreased from baseline.   Review of Systems Negative except for above complaint   Objective:   Physical Exam  Reviewed testosterone  levels are 227.      Assessment & Plan: Fatigue   Will consult to urology for evaluation of testosterone  replacement therapy.

## 2024-03-03 NOTE — Addendum Note (Signed)
 Addended by: ELUTERIO JENKINS HERO on: 03/03/2024 10:59 AM   Modules accepted: Orders

## 2024-03-03 NOTE — Progress Notes (Signed)
 Here to discuss low T labs with provider.

## 2024-03-28 ENCOUNTER — Telehealth: Payer: Self-pay

## 2024-03-28 NOTE — Telephone Encounter (Signed)
"  error  "

## 2024-04-01 ENCOUNTER — Other Ambulatory Visit: Payer: Self-pay

## 2024-04-01 DIAGNOSIS — R7989 Other specified abnormal findings of blood chemistry: Secondary | ICD-10-CM

## 2024-04-01 NOTE — Progress Notes (Signed)
 Second level of testosterone  needed for referral.

## 2024-04-02 LAB — TESTOSTERONE: Testosterone: 383 ng/dL (ref 264–916)
